# Patient Record
Sex: Male | Born: 1960
Health system: Southern US, Community
[De-identification: ages and names within clinical notes are randomized; demographics above are authoritative.]

## PROBLEM LIST (undated history)

## (undated) DIAGNOSIS — G4730 Sleep apnea, unspecified: Secondary | ICD-10-CM

## (undated) DIAGNOSIS — J189 Pneumonia, unspecified organism: Secondary | ICD-10-CM

## (undated) DIAGNOSIS — C801 Malignant (primary) neoplasm, unspecified: Secondary | ICD-10-CM

## (undated) DIAGNOSIS — G479 Sleep disorder, unspecified: Secondary | ICD-10-CM

## (undated) HISTORY — PX: OTHER SURGICAL HISTORY: SHX169

## (undated) HISTORY — DX: Malignant (primary) neoplasm, unspecified: C80.1

## (undated) HISTORY — DX: Sleep disorder, unspecified: G47.9

## (undated) HISTORY — PX: BONE MARROW ASPIRATION: SHX1252

---

## 2002-04-04 ENCOUNTER — Encounter: Payer: Self-pay | Admitting: Family Medicine

## 2002-04-04 ENCOUNTER — Ambulatory Visit (HOSPITAL_COMMUNITY): Admission: RE | Admit: 2002-04-04 | Discharge: 2002-04-04 | Payer: Self-pay | Admitting: Family Medicine

## 2003-03-17 ENCOUNTER — Emergency Department (HOSPITAL_COMMUNITY): Admission: EM | Admit: 2003-03-17 | Discharge: 2003-03-17 | Payer: Self-pay | Admitting: *Deleted

## 2005-05-29 ENCOUNTER — Ambulatory Visit (HOSPITAL_COMMUNITY): Admission: RE | Admit: 2005-05-29 | Discharge: 2005-05-29 | Payer: Self-pay | Admitting: Family Medicine

## 2011-04-18 ENCOUNTER — Ambulatory Visit (INDEPENDENT_AMBULATORY_CARE_PROVIDER_SITE_OTHER): Payer: BC Managed Care – PPO | Admitting: Physician Assistant

## 2011-04-18 VITALS — BP 133/75 | HR 70 | Temp 98.7°F | Resp 16 | Ht 71.5 in | Wt 281.8 lb

## 2011-04-18 DIAGNOSIS — L02419 Cutaneous abscess of limb, unspecified: Secondary | ICD-10-CM

## 2011-04-18 DIAGNOSIS — Z8249 Family history of ischemic heart disease and other diseases of the circulatory system: Secondary | ICD-10-CM | POA: Insufficient documentation

## 2011-04-18 DIAGNOSIS — Z8042 Family history of malignant neoplasm of prostate: Secondary | ICD-10-CM

## 2011-04-18 MED ORDER — DOXYCYCLINE HYCLATE 100 MG PO TABS: 100.0000 mg | ORAL_TABLET | Freq: Two times a day (BID) | ORAL | Status: AC

## 2011-04-18 NOTE — Progress Notes (Signed)
  Subjective:    Patient ID: Bryce Lewis, male    DOB: 1960-08-01, 51 y.o.   MRN: [id number]  HPI  51 yo male here with painful area R calf that has progressively worsened over the last 2 days.  Painful, swollen and tender.  No f/c.  No h/o MRSA.    Upon reviewing patient's chart, noticed his father had an MI at age 56.  Patient has had a cardiac work up bc of this.  His father also had prostate cancer and he has started having his PSA and and DRE done annually.  Review of Systems  All other systems reviewed and are negative.       Objective:   Physical Exam  Nursing note and vitals reviewed. Constitutional: He appears well-developed and well-nourished.       obese  Cardiovascular: Normal rate, regular rhythm, normal heart sounds and intact distal pulses.  Exam reveals no gallop and no friction rub.   No murmur heard. Pulmonary/Chest: Effort normal and breath sounds normal.  Skin: There is erythema.       R lateral lower leg ~ midway down 3cm induration and central scab present. Erythema extends out 12cm high and 5cm across.  No central fluctuance.      Assessment & Plan:  Abscess/cellulitis-not ready to open.  Salt water soaks. Aggressive rest and elevation.  No work tomorrow bc patient would have to be on his feet all day.  Start Doxy #2 from stock bottle given, take 1 tonight, 1 tomorrow.  Cleansed area.  Applied 2% mupiricin (and gave patient some in sterile container) then applied telfa, gauze and ace wraps X 2.

## 2011-04-18 NOTE — Patient Instructions (Signed)
Wound care as discussed.  Cleanse and soak bid. Recheck Thursday if not improving, sooner if worse.

## 2011-09-14 HISTORY — PX: NECK DISSECTION: SUR422

## 2011-09-14 HISTORY — PX: PAROTIDECTOMY: SUR1003

## 2013-06-20 ENCOUNTER — Encounter: Payer: Self-pay | Admitting: *Deleted

## 2013-06-23 ENCOUNTER — Encounter: Payer: Self-pay | Admitting: Neurology

## 2013-06-23 ENCOUNTER — Ambulatory Visit (INDEPENDENT_AMBULATORY_CARE_PROVIDER_SITE_OTHER): Payer: BC Managed Care – PPO | Admitting: Neurology

## 2013-06-23 ENCOUNTER — Encounter (INDEPENDENT_AMBULATORY_CARE_PROVIDER_SITE_OTHER): Payer: Self-pay

## 2013-06-23 VITALS — BP 135/80 | HR 60 | Temp 98.5°F | Ht 71.5 in | Wt 282.0 lb

## 2013-06-23 DIAGNOSIS — R51 Headache: Secondary | ICD-10-CM

## 2013-06-23 DIAGNOSIS — E669 Obesity, unspecified: Secondary | ICD-10-CM

## 2013-06-23 DIAGNOSIS — R519 Headache, unspecified: Secondary | ICD-10-CM

## 2013-06-23 DIAGNOSIS — G479 Sleep disorder, unspecified: Secondary | ICD-10-CM

## 2013-06-23 DIAGNOSIS — G4733 Obstructive sleep apnea (adult) (pediatric): Secondary | ICD-10-CM

## 2013-06-23 HISTORY — DX: Sleep disorder, unspecified: G47.9

## 2013-06-23 NOTE — Progress Notes (Signed)
Subjective:    Patient ID: Bryce Lewis is a 53 y.o. male.  HPI    Star Age, MD, PhD [facility] [address], [address] [place], [place] [address]  Dear Dr. Ethlyn Gallery,   I saw your patient, Bryce Lewis, upon your kind request in my neurologic clinic today for initial consultation of his sleep disorder, in particular, concern for underlying obstructive sleep apnea in the face of a prior abnormal overnight pulse oximetry test. The patient is unaccompanied today. As you know, Mr. Bryce Lewis is a 53 year old right-handed gentleman with an underlying medical history of osteomyelitis as a child, SCC, s/p skin grafting in 2012 and obesity, who had a overnight pulse oximetry test which I reviewed. This was ordered in the context of a cough and congestion with body aches reported at the time which was in October 2014. His pulse oximetry test from 10/25/2012 showed a mean oxygen saturation of 92.1%, a nadir of 84% and time in minutes below the saturation of 88% of 6 minutes and 52 seconds. He reports daytime sleepiness and snoring and morning HAs.   His typical bedtime is reported to be around 10 PM and usual wake time is around 5:30 AM. Sleep onset typically occurs within a few minutes. He reports feeling marginally rested upon awakening. He wakes up on an average 3 or 4 times in the middle of the night and has to go to the bathroom 0 times on a typical night. He admits to frequent morning headaches, which are dull, achy, frontal or occipital.  He reports excessive daytime somnolence (EDS) and His Epworth Sleepiness Score (ESS) is 7/24 today. He has not fallen asleep while driving. The patient has not been taking a scheduled nap.  He has been known to snore for the past many years. Snoring is reportedly moderate and associated with choking sounds. The patient admits to a sense of choking or strangling feeling. There is no report of nighttime reflux, with rare  nighttime cough experienced. The patient has not noted any RLS symptoms and is not known to kick while asleep or before falling asleep. There is no family history of RLS or OSA.  He is a fairly restless sleeper and in the morning, the bed is quite disheveled.   He denies cataplexy, sleep paralysis, hypnagogic or hypnopompic hallucinations, or sleep attacks. He does not report any vivid dreams, nightmares, dream enactments, or parasomnias, such as sleep talking or sleep walking. The patient has not had a sleep study or a home sleep test.  He consumes 6 caffeinated beverages per day, usually in the form of 2 coffee, and 3-4 sodas (diet Coke).  His bedroom is usually dark and cool. There is a TV in the bedroom and usually it is not on at night. There are 2 dogs in his bed typically. He quit smoking in 2011, he drinks alcohol occasionally.  His Past Medical History Is Significant For: Past Medical History  Diagnosis Date  . Cancer     His Past Surgical History Is Significant For: Past Surgical History  Procedure Laterality Date  . Melanoma excision      His Family History Is Significant For: Family History  Problem Relation Age of Onset  . Hypertension Mother   . Heart disease Father     His Social History Is Significant For: History   Social History  . Marital Status: Single    Spouse Name: N/A    Number of Children: N/A  . Years  of Education: N/A   Social History Main Topics  . Smoking status: Former Research scientist (life sciences)  . Smokeless tobacco: None  . Alcohol Use: No  . Drug Use: None  . Sexual Activity: None   Other Topics Concern  . None   Social History Narrative  . None    His Allergies Are:  Allergies  Allergen Reactions  . Lansoprazole   :   His Current Medications Are:  Outpatient Encounter Prescriptions as of 06/23/2013  Medication Sig  . ibuprofen (ADVIL,MOTRIN) 200 MG tablet Take 200 mg by mouth every 6 (six) hours as needed.  :  Review of Systems:  Out of a  complete 14 point review of systems, all are reviewed and negative with the exception of these symptoms as listed below: Review of Systems  Constitutional: Negative.   HENT: Negative.   Eyes: Negative.   Respiratory: Negative.   Cardiovascular: Negative.   Gastrointestinal: Negative.   Endocrine: Negative.   Genitourinary: Negative.   Musculoskeletal: Negative.   Skin: Negative.   Allergic/Immunologic: Negative.   Neurological: Negative.   Hematological: Negative.   Psychiatric/Behavioral: Positive for sleep disturbance (snoring).    Objective:  Neurologic Exam  Physical Exam Physical Examination:   Filed Vitals:   06/23/13 0818  BP: 135/80  Pulse: 60  Temp: 98.5 F (36.9 C)   General Examination: The patient is a very pleasant 53 y.o. male in no acute distress. He appears well-developed and well-nourished and well groomed. He is obese.  HEENT: Normocephalic, atraumatic, pupils are equal, round and reactive to light and accommodation. Funduscopic exam is normal with sharp disc margins noted. Extraocular tracking is good without limitation to gaze excursion or nystagmus noted. Normal smooth pursuit is noted. Hearing is grossly intact. Tympanic membranes are clear bilaterally. Face is symmetric with normal facial animation and normal facial sensation. Speech is clear with no dysarthria noted. There is no hypophonia. There is no lip, neck/head, jaw or voice tremor. Neck is supple with full range of passive and active motion. There are no carotid bruits on auscultation. Oropharynx exam reveals: mild mouth dryness, adequate dental hygiene and moderate airway crowding, due to large tongue and right tonsil being on the larger side. Mallampati is class II. Tongue protrudes centrally and palate elevates symmetrically. Tonsils are 2+ on the right and 1+ on the left. Neck size is 18.5 inches. He has a Absent overbite with a slight underbite noted . Nasal inspection reveals no significant nasal  mucosal bogginess or redness and no septal deviation. He has a R facial skin graft.   Chest: Clear to auscultation without wheezing, rhonchi or crackles noted.  Heart: S1+S2+0, regular and normal without murmurs, rubs or gallops noted.   Abdomen: Soft, non-tender and non-distended with normal bowel sounds appreciated on auscultation.  Extremities: There is no pitting edema in the distal lower extremities bilaterally. Pedal pulses are intact. He has a scar below the right knee in the medial aspect from previous surgery of his osteomyelitis as a child.  Skin: Warm and dry without trophic changes noted. There are no varicose veins.  Musculoskeletal: exam reveals no obvious joint deformities, tenderness or joint swelling or erythema.   Neurologically:  Mental status: The patient is awake, alert and oriented in all 4 spheres. His immediate and remote memory, attention, language skills and fund of knowledge are appropriate. There is no evidence of aphasia, agnosia, apraxia or anomia. Speech is clear with normal prosody and enunciation. Thought process is linear. Mood is normal and affect  is normal.  Cranial nerves II - XII are as described above under HEENT exam. In addition: shoulder shrug is normal with equal shoulder height noted. Motor exam: Normal bulk, strength and tone is noted. There is no drift, tremor or rebound. Romberg is negative. Reflexes are 2+ throughout. Babinski: Toes are flexor bilaterally. Fine motor skills and coordination: intact with normal finger taps, normal hand movements, normal rapid alternating patting, normal foot taps and normal foot agility.  Cerebellar testing: No dysmetria or intention tremor on finger to nose testing. Heel to shin is unremarkable bilaterally. There is no truncal or gait ataxia.  Sensory exam: intact to light touch, pinprick, vibration, temperature sense in the upper and lower extremities.  Gait, station and balance: He stands easily. No veering to one  side is noted. No leaning to one side is noted. Posture is age-appropriate and stance is narrow based. Gait shows normal stride length and normal pace. No problems turning are noted. He turns en bloc. Tandem walk is unremarkable. Intact toe and heel stance is noted.               Assessment and Plan:   In summary, YUMA PACELLA is a very pleasant 53 y.o.-year old male with an underlying medical history of osteomyelitis as a child, SCC, s/p skin grafting in 2012 and obesity, who had a overnight pulse oximetry test in October 2014 and reports snoring, daytime somnolence, recurrent morning headaches and waking up with a sense of gasping. His history and physical exam concerning for obstructive sleep apnea (OSA). I had a long chat with the patient about my findings and the diagnosis of OSA, its prognosis and treatment options. We talked about medical treatments, surgical interventions and non-pharmacological approaches. I explained in particular the risks and ramifications of untreated moderate to severe OSA, especially with respect to developing cardiovascular disease down the Road, including congestive heart failure, difficult to treat hypertension, cardiac arrhythmias, or stroke. Even type 2 diabetes has, in part, been linked to untreated OSA. Symptoms of untreated OSA include daytime sleepiness, memory problems, mood irritability and mood disorder such as depression and anxiety, lack of energy, as well as recurrent headaches, especially morning headaches. We talked about trying to maintain a healthy lifestyle in general, as well as the importance of weight control. I encouraged the patient to eat healthy, exercise daily and keep well hydrated, to keep a scheduled bedtime and wake time routine, to not skip any meals and eat healthy snacks in between meals. I advised the patient not to drive when feeling sleepy. I recommended the following at this time: sleep study with potential positive airway pressure  titration.  I explained the sleep test procedure to the patient and also outlined possible surgical and non-surgical treatment options of OSA, including the use of a custom-made dental device (which would require a referral to a specialist dentist or oral surgeon), upper airway surgical options, such as pillar implants, radiofrequency surgery, tongue base surgery, and UPPP (which would involve a referral to an ENT surgeon). Rarely, jaw surgery such as mandibular advancement may be considered.  I also explained the CPAP treatment option to the patient, who indicated that he would be willing to try CPAP if the need arises. I explained the importance of being compliant with PAP treatment, not only for insurance purposes but primarily to improve His symptoms, and for the patient's long term health benefit, including to reduce His cardiovascular risks. I answered all his questions today and the patient was  in agreement. I would like to see him back after the sleep study is completed and encouraged him to call with any interim questions, concerns, problems or updates.   Thank you very much for allowing me to participate in the care of this nice patient. If I can be of any further assistance to you please do not hesitate to call me at [phone].  Sincerely,   Star Age, MD, PhD

## 2013-06-23 NOTE — Patient Instructions (Addendum)
Based on your symptoms and your exam I believe you are at risk for obstructive sleep apnea or OSA, and I think we should proceed with a sleep study to determine whether you do or do not have OSA and how severe it is. If you have more than mild OSA, I want you to consider treatment with CPAP. Please remember, the risks and ramifications of moderate to severe obstructive sleep apnea or OSA are: Cardiovascular disease, including congestive heart failure, stroke, difficult to control hypertension, arrhythmias, and even type 2 diabetes has been linked to untreated OSA. Sleep apnea causes disruption of sleep and sleep deprivation in most cases, which, in turn, can cause recurrent headaches, problems with memory, mood, concentration, focus, and vigilance. Most people with untreated sleep apnea report excessive daytime sleepiness, which can affect their ability to drive. Please do not drive if you feel sleepy.  I will see you back after your sleep study to go over the test results and where to go from there. We will call you after your sleep study and to set up an appointment at the time.   Please remember to try to maintain good sleep hygiene, which means: Keep a regular sleep and wake schedule, try not to exercise or have a meal within 2 hours of your bedtime, try to keep your bedroom conducive for sleep, that is, cool and dark, without light distractors such as an illuminated alarm clock, and refrain from watching TV right before sleep or in the middle of the night and do not keep the TV or radio on during the night. Also, try not to use or play on electronic devices at bedtime, such as your cell phone, tablet PC or laptop. If you like to read at bedtime on an electronic device, try to dim the background light as much as possible. Do not eat in the middle of the night.   Please reduce your caffeine intake and move up the time of your last caffeine drink.

## 2013-08-01 ENCOUNTER — Encounter: Payer: Self-pay | Admitting: Neurology

## 2013-08-01 ENCOUNTER — Ambulatory Visit (INDEPENDENT_AMBULATORY_CARE_PROVIDER_SITE_OTHER): Payer: BC Managed Care – PPO | Admitting: Neurology

## 2013-08-01 DIAGNOSIS — G4733 Obstructive sleep apnea (adult) (pediatric): Secondary | ICD-10-CM

## 2013-08-01 DIAGNOSIS — G479 Sleep disorder, unspecified: Secondary | ICD-10-CM

## 2013-08-01 DIAGNOSIS — E669 Obesity, unspecified: Secondary | ICD-10-CM

## 2013-08-15 ENCOUNTER — Telehealth: Payer: Self-pay | Admitting: Neurology

## 2013-08-15 NOTE — Telephone Encounter (Signed)
This patient has overall mild sleep disordered breathing, more pronounced in supine sleep and moderate in degree when in REM sleep. Due to the overall mild nature of the patient's sleep apnea, there are several therapeutic avenues available. I would like to discuss options during FU appt. pls call patient to notify and arrange appt. thx

## 2013-08-16 NOTE — Telephone Encounter (Signed)
Called the patient and discussed sleep study results.  He is aware of finding of mild osa based on sleep study and Dr. Guadelupe Sabin recommendation that he schedule a follow up appointment with her to discuss results in detail and treatment options.  Pt has scheduled follow up.  A copy of this report will be sent to Dr. Micheline Rough.  The patient will receive the sleep study report via mail.

## 2013-09-03 ENCOUNTER — Encounter: Payer: Self-pay | Admitting: Neurology

## 2013-09-03 ENCOUNTER — Ambulatory Visit (INDEPENDENT_AMBULATORY_CARE_PROVIDER_SITE_OTHER): Payer: BC Managed Care – PPO | Admitting: Neurology

## 2013-09-03 VITALS — BP 118/79 | HR 61 | Temp 97.9°F | Ht 72.0 in | Wt 288.0 lb

## 2013-09-03 DIAGNOSIS — G479 Sleep disorder, unspecified: Secondary | ICD-10-CM

## 2013-09-03 DIAGNOSIS — R51 Headache: Secondary | ICD-10-CM

## 2013-09-03 DIAGNOSIS — G4733 Obstructive sleep apnea (adult) (pediatric): Secondary | ICD-10-CM

## 2013-09-03 DIAGNOSIS — R519 Headache, unspecified: Secondary | ICD-10-CM

## 2013-09-03 DIAGNOSIS — E669 Obesity, unspecified: Secondary | ICD-10-CM

## 2013-09-03 NOTE — Patient Instructions (Signed)
We will let you try autoPAP at home for treatment of your OSA. I will see you back in about 8 weeks.

## 2013-09-03 NOTE — Progress Notes (Signed)
Subjective:    Patient ID: Bryce Lewis is a 53 y.o. male.  HPI    Interim history:   Bryce Lewis is a 53 year old right-handed gentleman with an underlying medical history of osteomyelitis as a child, SCC, s/p skin grafting in 2012 and obesity, who presents for followup consultation after his recent baseline sleep study. He is unaccompanied today. I first met him on 06/23/2013 at the request of his primary care physician, at which time he reported an abnormal overnight pulse oximetry test which I reviewed. This was done at a time when he suffered from a cough and congestion with body aches reported at the time. His pulse oximetry test from 10/25/2012 showed a mean oxygen saturation of 92.1%, a nadir of 84% and time in minutes below the saturation of 88% of 6 minutes and 52 seconds. He reported daytime sleepiness, snoring and morning HAs. I suggested he return for sleep study. He had a baseline sleep study on 08/01/2013 and went over his test results with him in detail today. His sleep efficiency was normal at 90.3% with a latency to sleep normal at 14 minutes. Wake after sleep onset was 27.5 minutes with moderate sleep fragmentation noted. He had a normal arousal index. He had a normal percentage of light stage sleep, and normal percentage of deep sleep, and a normal percentage of REM sleep, with a normal REM latency. He had no significant PLMS. He had no significant EKG changes. Mild to moderate snoring was noted. He slept mostly on his back. His total AHI was 9.5 per hour rising to 29.4 per hour in REM sleep, and 13 per hour in the supine position. Baseline oxygen saturation was 91% with a nadir of 80% during REM sleep period time below 88% saturation was 12 minutes and 40 seconds. Today, he reports no change in his medical hx or Sx. He has AH HAs about 3/7 days.   His Past Medical History Is Significant For: Past Medical History  Diagnosis Date  . Cancer   . Sleep disturbance 06/23/2013     His Past Surgical History Is Significant For: Past Surgical History  Procedure Laterality Date  . Melanoma excision      Her Family History Is Significant For: Family History  Problem Relation Age of Onset  . Hypertension Mother   . Parkinson's disease Mother   . Heart disease Father     His Social History Is Significant For: History   Social History  . Marital Status: Single    Spouse Name: N/A    Number of Children: N/A  . Years of Education: N/A   Social History Main Topics  . Smoking status: Former Research scientist (life sciences)  . Smokeless tobacco: Never Used  . Alcohol Use: No  . Drug Use: None  . Sexual Activity: None   Other Topics Concern  . None   Social History Narrative  . None    His Allergies Are:  Allergies  Allergen Reactions  . Lansoprazole   :   His Current Medications Are:  Outpatient Encounter Prescriptions as of 09/03/2013  Medication Sig  . ibuprofen (ADVIL,MOTRIN) 200 MG tablet Take 200 mg by mouth every 6 (six) hours as needed.  :  Review of Systems:  Out of a complete 14 point review of systems, all are reviewed and negative with the exception of these symptoms as listed below:   Review of Systems  All other systems reviewed and are negative.  Objective:  Neurologic Exam  Physical Exam Physical Examination:  Filed Vitals:   09/03/13 0828  BP: 118/79  Pulse: 61  Temp: 97.9 F (36.6 C)   General Examination: The patient is a very pleasant 53 y.o. male in no acute distress. He appears well-developed and well-nourished and well groomed. He is obese. He has gained a few pounds.  HEENT: Normocephalic, atraumatic, pupils are equal, round and reactive to light and accommodation. Funduscopic exam is normal with sharp disc margins noted. Extraocular tracking is good without limitation to gaze excursion or nystagmus noted. Normal smooth pursuit is noted. Hearing is grossly intact. Face is symmetric with normal facial animation and normal facial  sensation. Speech is clear with no dysarthria noted. There is no hypophonia. There is no lip, neck/head, jaw or voice tremor. Neck is supple with full range of passive and active motion. There are no carotid bruits on auscultation. Oropharynx exam reveals: mild mouth dryness, adequate dental hygiene and moderate airway crowding, due to large tongue and right tonsil being on the larger side. Mallampati is class II. Tongue protrudes centrally and palate elevates symmetrically. Tonsils are 2+ on the right and 1+ on the left. Neck size is 18.5 inches. He has a Absent overbite with a slight underbite noted . Nasal inspection reveals no significant nasal mucosal bogginess or redness and no septal deviation. He has a R facial skin graft, unchanged and unremarkable.   Chest: Clear to auscultation without wheezing, rhonchi or crackles noted.  Heart: S1+S2+0, regular and normal without murmurs, rubs or gallops noted.   Abdomen: Soft, non-tender and non-distended with normal bowel sounds appreciated on auscultation.  Extremities: There is no pitting edema in the distal lower extremities bilaterally. Pedal pulses are intact. He has a scar below the right knee in the medial aspect from previous surgery of his osteomyelitis as a child.  Skin: Warm and dry without trophic changes noted. There are a mild degree of varicose veins in the left calf, which are unchanged from before, which were not documented last time.  Musculoskeletal: exam reveals no obvious joint deformities, tenderness or joint swelling or erythema.   Neurologically:  Mental status: The patient is awake, alert and oriented in all 4 spheres. His immediate and remote memory, attention, language skills and fund of knowledge are appropriate. There is no evidence of aphasia, agnosia, apraxia or anomia. Speech is clear with normal prosody and enunciation. Thought process is linear. Mood is normal and affect is normal.  Cranial nerves II - XII are as  described above under HEENT exam. In addition: shoulder shrug is normal with equal shoulder height noted. Motor exam: Normal bulk, strength and tone is noted. There is no drift, tremor or rebound. Romberg is negative. Reflexes are 2+ throughout. Fine motor skills and coordination: intact with normal finger taps, normal hand movements, normal rapid alternating patting, normal foot taps and normal foot agility.  Cerebellar testing: No dysmetria or intention tremor on finger to nose testing. Heel to shin is unremarkable bilaterally. There is no truncal or gait ataxia.  Sensory exam: intact to light touch.  Gait, station and balance: He stands easily. No veering to one side is noted. No leaning to one side is noted. Posture is age-appropriate and stance is narrow based. Gait shows normal stride length and normal pace. No problems turning are noted. He turns en bloc. Tandem walk is unremarkable. Intact toe and heel stance is noted.               Assessment and Plan:   In summary, Leonardo  D Ahlquist is a very pleasant 53 year old male with an underlying medical history of osteomyelitis as a child, SCC, s/p skin grafting in 2012 and obesity, who had an abnormal overnight pulse oximetry test in October 2014 and reported snoring, daytime somnolence, recurrent morning headaches and waking up with a sense of gasping. His recent baseline sleep study demonstrated overall mild sleep disordered breathing but moderate REM-related obstructive sleep apnea with an oxyhemoglobin desaturation nadir of 80% and a significant time below 88% saturation for a total of almost 13 minutes . Today I explained the sleep test results in detail to the patient and we talked about the diagnosis of OSA, its prognosis and treatment options. We talked about medical treatments, surgical interventions and non-pharmacological approaches. I explained in particular the risks and ramifications of untreated  OSA, especially moderate to severe OSA,   particularly with respect to developing cardiovascular disease down the Road, including congestive heart failure, difficult to treat hypertension, cardiac arrhythmias, or stroke. Even type 2 diabetes has, in part, been linked to untreated OSA. Symptoms of untreated OSA include daytime sleepiness, memory problems, mood irritability and mood disorder such as depression and anxiety, lack of energy, as well as recurrent headaches, especially morning headaches. We talked about trying to maintain a healthy lifestyle in general, as well as the importance of weight control. I encouraged the patient to eat healthy, exercise daily and keep well hydrated, to keep a scheduled bedtime and wake time routine, to not skip any meals and eat healthy snacks in between meals. I advised the patient not to drive when feeling sleepy. I recommended the following at this time: since he does not have an overbite and rather has a slight underbite, I think he is not going to be an appropriate candidate for an oral appliance. Weight loss is certainly a treatment option but does take time and is going to take a significant patient effort. Patient's work higher than have lack of energy do find it much harder to participate in physical activities. Surgical options he would like to utilize only as a last resort. At this juncture, I would like to suggest he try AutoPap at home. We could set him up with a positive airway pressure ranged at home and give him an opportunity to try out AutoPap for about 6 weeks or so and I will see him back afterwards. He was agreeable to pursuing this route of treatment and I placed an order for AutoPap set up at home. I will see him back in about 6-8 weeks with a download. He is advised to be compliant with treatment. He is advised to call with any interim questions.

## 2013-10-17 ENCOUNTER — Encounter: Payer: Self-pay | Admitting: Neurology

## 2013-10-27 ENCOUNTER — Encounter: Payer: Self-pay | Admitting: Neurology

## 2013-10-28 ENCOUNTER — Encounter: Payer: Self-pay | Admitting: Neurology

## 2013-10-28 ENCOUNTER — Ambulatory Visit (INDEPENDENT_AMBULATORY_CARE_PROVIDER_SITE_OTHER): Payer: BC Managed Care – PPO | Admitting: Neurology

## 2013-10-28 ENCOUNTER — Encounter (INDEPENDENT_AMBULATORY_CARE_PROVIDER_SITE_OTHER): Payer: Self-pay

## 2013-10-28 VITALS — BP 129/78 | HR 66 | Temp 98.6°F | Ht 72.0 in | Wt 288.0 lb

## 2013-10-28 DIAGNOSIS — Z9989 Dependence on other enabling machines and devices: Principal | ICD-10-CM

## 2013-10-28 DIAGNOSIS — E669 Obesity, unspecified: Secondary | ICD-10-CM

## 2013-10-28 DIAGNOSIS — G4733 Obstructive sleep apnea (adult) (pediatric): Secondary | ICD-10-CM

## 2013-10-28 NOTE — Progress Notes (Signed)
Subjective:    Patient ID: Bryce Lewis is a 53 y.o. male.  HPI    Interim history:  Mr. Hillery is a 53 year old right-handed gentleman with an underlying medical history of osteomyelitis as a child, SCC, s/p skin grafting in 2012 and obesity, who presents for followup consultation of his obstructive sleep apnea treated with AutoPap at home. He is unaccompanied today. I last saw him on 09/03/2013, at which time we talked about his sleep study results and I suggested a trial of AutoPap and a closer followup with compliance data.  Today, I reviewed his compliance data from 09/27/2013 to 10/26/2013 which is a total of 30 days during which time he used his machine 26 days. Percent most days greater than 4 hours was 23 days, which is 77%, adequate compliance. Pressure ranges from 6-13 cm with 95th percentile at 7.7 cm EPR of 3. Leak was low with 95th percentile at 3.3 L per minute, residual AHI level at 1.1 per hour, average usage only 4 hours and 49 minutes for all days and 5 hours and 33 minutes for days used.  Today, he reports doing better with positive airway pressure treatment. He sleeps. He notices the biggest difference and has morning headaches which have essentially resolved since he started treatment. He's very pleased with that I would like to continue using CPAP. He skipped a few days because of the weekend. He says that his grandchildren usually come in and stay over and hemet him on had skipped a few days because of that. In the beginning he also had a few days where he was not able to use it over 4 hours because he hadn't quite adjusted yet. Since then, he has adjusted well to treatment. He does not have much in the way of mouth dryness or difficulty using the mask or machine. He has had no other changes to his medical history or medications. Weight is stable.  I first saw him on 06/23/2013 at the request of his primary care physician, at which time he reported an abnormal overnight pulse  oximetry test which I reviewed. This was done at a time when he suffered from a cough and congestion with body aches reported at the time. His pulse oximetry test from 10/25/2012 showed a mean oxygen saturation of 92.1%, a nadir of 84% and time in minutes below the saturation of 88% of 6 minutes and 52 seconds. He reported daytime sleepiness, snoring and morning HAs. I suggested he return for sleep study. He had a baseline sleep study on 08/01/2013. His sleep efficiency was normal at 90.3% with a latency to sleep normal at 14 minutes. Wake after sleep onset was 27.5 minutes with moderate sleep fragmentation noted. He had a normal arousal index. He had a normal percentage of light stage sleep, and normal percentage of deep sleep, and a normal percentage of REM sleep, with a normal REM latency. He had no significant PLMs. He had no significant EKG changes. Mild to moderate snoring was noted. He slept mostly on his back. His total AHI was 9.5 per hour rising to 29.4 per hour in REM sleep, and 13 per hour in the supine position. Baseline oxygen saturation was 91% with a nadir of 80% during REM sleep period time below 88% saturation was 12 minutes and 40 seconds.   His Past Medical History Is Significant For: Past Medical History  Diagnosis Date  . Cancer   . Sleep disturbance 06/23/2013    His Past Surgical History Is  Significant For: Past Surgical History  Procedure Laterality Date  . Melanoma excision      His Family History Is Significant For: Family History  Problem Relation Age of Onset  . Hypertension Mother   . Parkinson's disease Mother   . Heart disease Father     His Social History Is Significant For: History   Social History  . Marital Status: Married    Spouse Name: Tabitha    Number of Children: 2  . Years of Education: 12   Occupational History  .      [employer]   Social History Main Topics  . Smoking status: Former Research scientist (life sciences)  . Smokeless tobacco: Never Used  .  Alcohol Use: Yes  . Drug Use: None  . Sexual Activity: None   Other Topics Concern  . None   Social History Narrative   Consumes a couple cups of caffeine daily, right handed    His Allergies Are:  Allergies  Allergen Reactions  . Lansoprazole   :   His Current Medications Are:  Outpatient Encounter Prescriptions as of 10/28/2013  Medication Sig  . ibuprofen (ADVIL,MOTRIN) 200 MG tablet Take 200 mg by mouth every 6 (six) hours as needed.  :  Review of Systems:  Out of a complete 14 point review of systems, all are reviewed and negative with the exception of these symptoms as listed below:  Review of Systems  Neurological:       Apnea,snoring    Objective:  Neurologic Exam  Physical Exam Physical Examination:   Filed Vitals:   10/28/13 0836  BP: 129/78  Pulse: 66  Temp: 98.6 F (37 C)   General Examination: The patient is a very pleasant 53 y.o. male in no acute distress. He appears well-developed and well-nourished and well groomed. He is obese. His weight is stable from last time.  HEENT: Normocephalic, atraumatic, pupils are equal, round and reactive to light and accommodation. Funduscopic exam is normal with sharp disc margins noted. Extraocular tracking is good without limitation to gaze excursion or nystagmus noted. Normal smooth pursuit is noted. Hearing is grossly intact. Face is symmetric with normal facial animation and normal facial sensation. Speech is clear with no dysarthria noted. There is no hypophonia. There is no lip, neck/head, jaw or voice tremor. Neck is supple with full range of passive and active motion. There are no carotid bruits on auscultation. Oropharynx exam reveals: mild mouth dryness, adequate dental hygiene and moderate airway crowding, due to large tongue and right tonsil being on the larger side. Mallampati is class II. Tongue protrudes centrally and palate elevates symmetrically. Tonsils are 2+ on the right and 1+ on the left. Nasal  inspection reveals no significant nasal mucosal bogginess or redness and no septal deviation and no soreness from the nasal pillows is noted. He has a R facial skin graft, unchanged and unremarkable.   Chest: Clear to auscultation without wheezing, rhonchi or crackles noted.  Heart: S1+S2+0, regular and normal without murmurs, rubs or gallops noted.   Abdomen: Soft, non-tender and non-distended with normal bowel sounds appreciated on auscultation.  Extremities: There is no pitting edema in the distal lower extremities bilaterally. Pedal pulses are intact. He has a scar below the right knee in the medial aspect from previous surgery of his osteomyelitis as a child.  Skin: Warm and dry without trophic changes noted. There are a mild degree of varicose veins in the left calf, which are unchanged from before, which were not documented  last time.  Musculoskeletal: exam reveals no obvious joint deformities, tenderness or joint swelling or erythema.   Neurologically:  Mental status: The patient is awake, alert and oriented in all 4 spheres. His immediate and remote memory, attention, language skills and fund of knowledge are appropriate. There is no evidence of aphasia, agnosia, apraxia or anomia. Speech is clear with normal prosody and enunciation. Thought process is linear. Mood is normal and affect is normal.  Cranial nerves II - XII are as described above under HEENT exam. In addition: shoulder shrug is normal with equal shoulder height noted. Motor exam: Normal bulk, strength and tone is noted. There is no drift, tremor or rebound. Romberg is negative. Reflexes are 2+ throughout. Fine motor skills and coordination: intact with normal finger taps, normal hand movements, normal rapid alternating patting, normal foot taps and normal foot agility.  Cerebellar testing: No dysmetria or intention tremor on finger to nose testing. Heel to shin is unremarkable bilaterally. There is no truncal or gait ataxia.   Sensory exam: intact to light touch, pinprick, temperature, vibration in the upper and lower extremities.  Gait, station and balance: He stands easily. No veering to one side is noted. No leaning to one side is noted. Posture is age-appropriate and stance is narrow based. Gait shows normal stride length and normal pace. No problems turning are noted. He turns en bloc. Tandem walk is slightly difficult in the beginning, then unremarkable.            Assessment and Plan:   In summary, ARLOW SPIERS is a very pleasant 53 year old male with an underlying medical history of osteomyelitis as a child, SCC, s/p skin grafting in 2012, and obesity, with an abnormal overnight pulse oximetry test in October 2014, who presents for followup consultation of his overall mild obstructive sleep apnea, more pronounced during REM sleep. He had reported snoring, morning headaches and waking up with a sense of gasping.  last time he was here in August I suggested a trial of Ovral patch. We talked about his sleep test results again and his compliance data. I congratulated him on his great compliance. I did advise him to try not to skip any days. He indicates improvement in his sleep but primarily his morning headaches which is a great benefit for him. He is pleased with how he's doing. I would like to change him from AutoPap to a set pressure of 8 cm which seems to be the pressure most of the time. His  leak is low which means that good fit of his nasal pillows which have not caused him any trouble such as nasal sores were significant nasal dryness. At this juncture, since he is doing better, would like to see him back in 6 months, sooner if the need arises and I encouraged him to continue using CPAP regularly. I did again advise him of the importance of weight control.  I have also asked him to sign up for her electronic chart access so he can e-mail me with questions if need be. He was in agreement.

## 2013-10-28 NOTE — Patient Instructions (Signed)

## 2014-04-02 ENCOUNTER — Telehealth: Payer: Self-pay | Admitting: Neurology

## 2014-04-02 ENCOUNTER — Encounter: Payer: Self-pay | Admitting: *Deleted

## 2014-04-02 NOTE — Telephone Encounter (Signed)
Left VM message to call back to reschedule appointment on 04/30/2014 due to template change.

## 2014-04-21 ENCOUNTER — Encounter: Payer: Self-pay | Admitting: Neurology

## 2014-04-21 ENCOUNTER — Ambulatory Visit (INDEPENDENT_AMBULATORY_CARE_PROVIDER_SITE_OTHER): Payer: BLUE CROSS/BLUE SHIELD | Admitting: Neurology

## 2014-04-21 VITALS — BP 136/86 | HR 78 | Resp 16 | Ht 72.0 in | Wt 299.0 lb

## 2014-04-21 DIAGNOSIS — R0981 Nasal congestion: Secondary | ICD-10-CM | POA: Diagnosis not present

## 2014-04-21 DIAGNOSIS — Z9989 Dependence on other enabling machines and devices: Principal | ICD-10-CM

## 2014-04-21 DIAGNOSIS — J349 Unspecified disorder of nose and nasal sinuses: Secondary | ICD-10-CM | POA: Diagnosis not present

## 2014-04-21 DIAGNOSIS — E669 Obesity, unspecified: Secondary | ICD-10-CM | POA: Diagnosis not present

## 2014-04-21 DIAGNOSIS — G4733 Obstructive sleep apnea (adult) (pediatric): Secondary | ICD-10-CM | POA: Diagnosis not present

## 2014-04-21 NOTE — Patient Instructions (Signed)
Please continue using your CPAP regularly. While your insurance requires that you use CPAP at least 4 hours each night on 70% of the nights, I recommend, that you not skip any nights and use it throughout the night if you can. Getting used to CPAP and staying with the treatment long term does take time and patience and discipline. Untreated obstructive sleep apnea when it is moderate to severe can have an adverse impact on cardiovascular health and raise her risk for heart disease, arrhythmias, hypertension, congestive heart failure, stroke and diabetes. Untreated obstructive sleep apnea causes sleep disruption, nonrestorative sleep, and sleep deprivation. This can have an impact on your day to day functioning and cause daytime sleepiness and impairment of cognitive function, memory loss, mood disturbance, and problems focussing. Using CPAP regularly can improve these symptoms.  Please look into using a salt water nasal rinse system, such as the [employer] and follow the directions and my instructions. It may help your nasal congestion and help you tolerate your CPAP.

## 2014-04-21 NOTE — Progress Notes (Signed)
Subjective:    Patient ID: Bryce Lewis is a 54 y.o. male.  HPI     Interim history:   Bryce Lewis is a 54 year old right-handed gentleman with an underlying medical history of osteomyelitis as a child, SCC, s/p skin grafting in 2012 and obesity, who presents for followup consultation of his obstructive sleep apnea treated with AutoPap at home. He is unaccompanied today. I last saw him on 10/28/2013, at which time he reported doing better with positive airway pressure treatment. He felt that his biggest improvement was the absence of morning headaches. He was very pleased with that. He had some difficulty adjusting in the very beginning but since then had been doing fine. He was on AutoPap at the time and change that to a set pressure of 8 cm at his last visit.   Today, 04/21/2014: I reviewed his CPAP compliance data from 03/21/2014 through 04/19/2014 which is a total of 30 days during which time he used his machine only 18 days with percent used days greater than 4 hours of only 47%, indicating suboptimal compliance with an average usage for days on machine at 4 hours and 53 minutes and for all days average usage was 2 hours and 56 minutes only. Residual AHI good at 1.1 per hour and leak acceptable with the 95th percentile at 12.9 L/m on a set pressure of 8 cm with EPR of 3.  Today, 04/21/2014: He reports that he had issues with nasal congestion and was not able to use his machine as well. As far as the pressure change, he did not notice any significant changes as far as the tolerance or mask fitting goes. He has tried over-the-counter Zyrtec. He is now on over-the-counter Flonase. He took some amoxicillin, which was actually prescribed for his wife. He did not see his primary care physician or went to urgent care. In fact, he needs to find a new primary care physician as his PCP has retired. He would like to establish care with Dr. Nevada Crane in [place], [place] and is on his wait list. He has had  some recent sinus type headaches. He has gained about 11 pounds since his last visit. He is trying to lose weight and is cutting out sodas from his diet.  Previously:  I saw him on 09/03/2013, at which time we talked about his sleep study results and I suggested a trial of AutoPap and a closer followup with compliance data.    I reviewed his compliance data from 09/27/2013 to 10/26/2013 which is a total of 30 days during which time he used his machine 26 days. Percent most days greater than 4 hours was 23 days, which is 77%, adequate compliance. Pressure ranges from 6-13 cm with 95th percentile at 7.7 cm EPR of 3. Leak was low with 95th percentile at 3.3 L per minute, residual AHI level at 1.1 per hour, average usage only 4 hours and 49 minutes for all days and 5 hours and 33 minutes for days used.  I first saw him on 06/23/2013 at the request of his primary care physician, at which time he reported an abnormal overnight pulse oximetry test which I reviewed. This was done at a time when he suffered from a cough and congestion with body aches reported at the time. His pulse oximetry test from 10/25/2012 showed a mean oxygen saturation of 92.1%, a nadir of 84% and time in minutes below the saturation of 88% of 6 minutes and 52 seconds. He reported daytime  sleepiness, snoring and morning HAs. I suggested he return for sleep study. He had a baseline sleep study on 08/01/2013. His sleep efficiency was normal at 90.3% with a latency to sleep normal at 14 minutes. Wake after sleep onset was 27.5 minutes with moderate sleep fragmentation noted. He had a normal arousal index. He had a normal percentage of light stage sleep, and normal percentage of deep sleep, and a normal percentage of REM sleep, with a normal REM latency. He had no significant PLMs. He had no significant EKG changes. Mild to moderate snoring was noted. He slept mostly on his back. His total AHI was 9.5 per hour rising to 29.4 per hour in REM sleep,  and 13 per hour in the supine position. Baseline oxygen saturation was 91% with a nadir of 80% during REM sleep period time below 88% saturation was 12 minutes and 40 seconds.    His Past Medical History Is Significant For: Past Medical History  Diagnosis Date  . Cancer   . Sleep disturbance 06/23/2013    His Past Surgical History Is Significant For: Past Surgical History  Procedure Laterality Date  . Melanoma excision      His Family History Is Significant For: Family History  Problem Relation Age of Onset  . Hypertension Mother   . Parkinson's disease Mother   . Heart disease Father     His Social History Is Significant For: History   Social History  . Marital Status: Married    Spouse Name: Bryce Lewis  . Number of Children: 2  . Years of Education: 12   Occupational History  .      [employer]   Social History Main Topics  . Smoking status: Former Research scientist (life sciences)  . Smokeless tobacco: Never Used  . Alcohol Use: Yes  . Drug Use: Not on file  . Sexual Activity: Not on file   Other Topics Concern  . None   Social History Narrative   Consumes a couple cups of caffeine daily, right handed    His Allergies Are:  Allergies  Allergen Reactions  . Lansoprazole   :   His Current Medications Are:  Outpatient Encounter Prescriptions as of 04/21/2014  Medication Sig  . ibuprofen (ADVIL,MOTRIN) 200 MG tablet Take 200 mg by mouth every 6 (six) hours as needed.  :  Review of Systems:  Out of a complete 14 point review of systems, all are reviewed and negative with the exception of these symptoms as listed below:   Review of Systems  All other systems reviewed and are negative.  Recent nasal congestion and sinus issues. Some weight gain.  Objective:  Neurologic Exam  Physical Exam Physical Examination:   Filed Vitals:   04/21/14 1545  BP: 136/86  Pulse: 78  Resp: 16   General Examination: The patient is a very pleasant 54 y.o. male in no acute distress.  He appears well-developed and well-nourished and well groomed. He is obese. His weight is  increased by about 11 pounds from last time.  HEENT: Normocephalic, atraumatic, pupils are equal, round and reactive to light and accommodation. Funduscopic exam is normal with sharp disc margins noted. Extraocular tracking is good without limitation to gaze excursion or nystagmus noted. Normal smooth pursuit is noted. Hearing is grossly intact. Face is symmetric with normal facial animation and normal facial sensation. Speech is clear with no dysarthria noted. There is no hypophonia. There is no lip, neck/head, jaw or voice tremor. Neck is supple with full range of  passive and active motion. There are no carotid bruits on auscultation. Oropharynx exam reveals: mild mouth dryness, adequate dental hygiene and moderate airway crowding, due to large tongue and right tonsil being on the larger side. Mallampati is class II. Tongue protrudes centrally and palate elevates symmetrically. Tonsils are 2+ on the right and 1+ on the left. Nasal inspection reveals no significant nasal mucosal bogginess, but mild redness R>L, and no septal deviation and no soreness from the nasal pillows is noted. He has a R facial skin graft, unchanged and unremarkable, with decreased sensation on his skin graft.   Chest: Clear to auscultation without wheezing, rhonchi or crackles noted.  Heart: S1+S2+0, regular and normal without murmurs, rubs or gallops noted.   Abdomen: Soft, non-tender and non-distended with normal bowel sounds appreciated on auscultation.  Extremities: There is no pitting edema in the distal lower extremities bilaterally. Pedal pulses are intact. He has a scar below the right knee in the medial aspect from previous surgery of his osteomyelitis as a child.  Skin: Warm and dry without trophic changes noted. There are a mild degree of varicose veins in the left calf, which are unchanged from before, which were not documented  last time.  Musculoskeletal: exam reveals no obvious joint deformities, tenderness or joint swelling or erythema.   Neurologically:  Mental status: The patient is awake, alert and oriented in all 4 spheres. His immediate and remote memory, attention, language skills and fund of knowledge are appropriate. There is no evidence of aphasia, agnosia, apraxia or anomia. Speech is clear with normal prosody and enunciation. Thought process is linear. Mood is normal and affect is normal.  Cranial nerves II - XII are as described above under HEENT exam. In addition: shoulder shrug is normal with equal shoulder height noted. Motor exam: Normal bulk, strength and tone is noted. There is no drift, tremor or rebound. Romberg is negative. Reflexes are 2+ throughout. Fine motor skills and coordination: intact with normal finger taps, normal hand movements, normal rapid alternating patting, normal foot taps and normal foot agility.  Cerebellar testing: No dysmetria or intention tremor on finger to nose testing. Heel to shin is unremarkable bilaterally. There is no truncal or gait ataxia.  Sensory exam: intact to light touch, pinprick, temperature, vibration in the upper and lower extremities.  Gait, station and balance: He stands easily. No veering to one side is noted. No leaning to one side is noted. Posture is age-appropriate and stance is narrow based. Gait shows normal stride length and normal pace. No problems turning are noted. He turns en bloc. Tandem walk is unremarkable.            Assessment and Plan:   In summary, Bryce Lewis is a very pleasant 54 year old male with an underlying medical history of osteomyelitis as a child, SCC, s/p surgery and skin grafting in 2012, and obesity, with an abnormal overnight pulse oximetry test in October 2014, who presents for followup consultation of his obstructive sleep apnea, more pronounced during REM sleep, established on CPAP treatment after an AutoPap trial. He  had reported snoring, morning headaches and waking up with a sense of gasping. We talked about his sleep test results again and his compliance data. Previously he was better with his compliance. He has had some lapses in treatment secondary to nasal congestion and sinus issues. Overall, he feels that since starting treatment his sleep had improved in particular his morning headaches. The current settings suggest a good treatment pressure  and acceptable leak. At this juncture,  I suggested a recheck in a few months. I also suggested he continue using Flonase for nasal decongestion and also look into nasal saline rinses which can help reduce nasal allergy symptoms and congestion. I encouraged him to continue using CPAP regularly. I did again advise him of the importance of weight control.  I have also asked him to sign up for My Chart so he can e-mail me with questions if need be. He was in agreement. I spent 15 minutes in total face-to-face time with the patient, more than 50% of which was spent in counseling and coordination of care, reviewing test results, reviewing medication and discussing or reviewing the diagnosis of OSA, its prognosis and treatment options.

## 2014-04-30 ENCOUNTER — Ambulatory Visit: Payer: BC Managed Care – PPO | Admitting: Neurology

## 2014-07-01 ENCOUNTER — Encounter (INDEPENDENT_AMBULATORY_CARE_PROVIDER_SITE_OTHER): Payer: Self-pay | Admitting: *Deleted

## 2014-07-23 ENCOUNTER — Encounter (INDEPENDENT_AMBULATORY_CARE_PROVIDER_SITE_OTHER): Payer: Self-pay | Admitting: Internal Medicine

## 2014-07-23 ENCOUNTER — Other Ambulatory Visit (INDEPENDENT_AMBULATORY_CARE_PROVIDER_SITE_OTHER): Payer: Self-pay | Admitting: *Deleted

## 2014-07-23 ENCOUNTER — Telehealth (INDEPENDENT_AMBULATORY_CARE_PROVIDER_SITE_OTHER): Payer: Self-pay | Admitting: *Deleted

## 2014-07-23 ENCOUNTER — Ambulatory Visit (INDEPENDENT_AMBULATORY_CARE_PROVIDER_SITE_OTHER): Payer: BLUE CROSS/BLUE SHIELD | Admitting: Internal Medicine

## 2014-07-23 VITALS — BP 116/70 | HR 72 | Temp 99.0°F | Ht 73.0 in | Wt 290.4 lb

## 2014-07-23 DIAGNOSIS — K625 Hemorrhage of anus and rectum: Secondary | ICD-10-CM

## 2014-07-23 NOTE — Progress Notes (Signed)
   Subjective:    Patient ID: Bryce Lewis, male    DOB: [birth year], 54 y.o.   MRN: [id number]  HPI Referred to our office by Dr. Wende Neighbors for rectal bleeding/diagnostic colonoscopy. He tells me he has had some rectal bleeding x 3. He noticed the bleeding when he wiped. Symptom x 1 month. No blood x 2 weeks. Stools were not hard nor did he strain. No change in his stools. He usually has a BM x 1 a day.  No melena. Appetite good. No unintentional weight loss. No abdominal pain.  No family hx of colon cancer. Takes Motrin/Aleve as needed but no very often. He has never had a colonoscopy in the past.  06/06/2014 H and H 16.3 and 46.9, MCV 89.7, Platelet ct 230.   Review of Systems Past Medical History  Diagnosis Date  . Cancer     Bone cancer as a child. Squamous to face  . Sleep disturbance 06/23/2013    Past Surgical History  Procedure Laterality Date  . Squamous cell carcinoma to rt side of face removed at [facility]      Allergies  Allergen Reactions  . Lansoprazole     Current Outpatient Prescriptions on File Prior to Visit  Medication Sig Dispense Refill  . ibuprofen (ADVIL,MOTRIN) 200 MG tablet Take 200 mg by mouth every 6 (six) hours as needed.     No current facility-administered medications on file prior to visit.        Objective:   Physical Exam Blood pressure 116/70, pulse 72, temperature 99 F (37.2 C), height 6\' 1"  (1.854 m), weight 290 lb 6.4 oz (131.725 kg). Alert and oriented. Skin warm and dry. Oral mucosa is moist.   . Sclera anicteric, conjunctivae is pink. Thyroid not enlarged. No cervical lymphadenopathy. Lungs clear. Heart regular rate and rhythm.  Abdomen is soft. Bowel sounds are positive. No hepatomegaly. No abdominal masses felt. No tenderness.  No edema to lower extremities.          Assessment & Plan:  Rectal bleeding. Colonic neoplasm needs to be ruled out. Polyp, hemorrhoids in the differential. Diagnostic colonoscopy. The risks and  benefits such as perforation, bleeding, and infection were reviewed with the patient and is agreeable.

## 2014-07-23 NOTE — Telephone Encounter (Signed)
Patient needs trilyte 

## 2014-07-23 NOTE — Patient Instructions (Addendum)
Colonoscopy.  The risks and benefits such as perforation, bleeding, and infection were reviewed with the patient and is agreeable. 

## 2014-07-24 ENCOUNTER — Encounter (INDEPENDENT_AMBULATORY_CARE_PROVIDER_SITE_OTHER): Payer: Self-pay

## 2014-07-24 MED ORDER — PEG 3350-KCL-NA BICARB-NACL 420 G PO SOLR: 4000.0000 mL | Freq: Once | ORAL | Status: DC

## 2014-08-13 ENCOUNTER — Ambulatory Visit (HOSPITAL_COMMUNITY)
Admission: RE | Admit: 2014-08-13 | Discharge: 2014-08-13 | Disposition: A | Discharge status: Home / Self Care | Payer: BLUE CROSS/BLUE SHIELD | Source: Ambulatory Visit | Attending: Internal Medicine | Admitting: Internal Medicine

## 2014-08-13 ENCOUNTER — Encounter (HOSPITAL_COMMUNITY)
Admission: RE | Disposition: A | Discharge status: Home / Self Care | Payer: Self-pay | Source: Ambulatory Visit | Attending: Internal Medicine

## 2014-08-13 ENCOUNTER — Encounter (HOSPITAL_COMMUNITY): Payer: Self-pay | Admitting: *Deleted

## 2014-08-13 DIAGNOSIS — C44329 Squamous cell carcinoma of skin of other parts of face: Secondary | ICD-10-CM | POA: Insufficient documentation

## 2014-08-13 DIAGNOSIS — K921 Melena: Secondary | ICD-10-CM | POA: Insufficient documentation

## 2014-08-13 DIAGNOSIS — K648 Other hemorrhoids: Secondary | ICD-10-CM | POA: Diagnosis not present

## 2014-08-13 DIAGNOSIS — Z87891 Personal history of nicotine dependence: Secondary | ICD-10-CM | POA: Insufficient documentation

## 2014-08-13 DIAGNOSIS — D125 Benign neoplasm of sigmoid colon: Secondary | ICD-10-CM | POA: Diagnosis not present

## 2014-08-13 DIAGNOSIS — K625 Hemorrhage of anus and rectum: Secondary | ICD-10-CM

## 2014-08-13 DIAGNOSIS — G4730 Sleep apnea, unspecified: Secondary | ICD-10-CM | POA: Insufficient documentation

## 2014-08-13 DIAGNOSIS — K644 Residual hemorrhoidal skin tags: Secondary | ICD-10-CM | POA: Diagnosis not present

## 2014-08-13 HISTORY — DX: Sleep apnea, unspecified: G47.30

## 2014-08-13 HISTORY — PX: COLONOSCOPY: SHX5424

## 2014-08-13 SURGERY — COLONOSCOPY
Anesthesia: Moderate Sedation

## 2014-08-13 MED ORDER — MIDAZOLAM HCL 5 MG/5ML IJ SOLN
INTRAMUSCULAR | Status: AC
  Filled 2014-08-13: qty 10

## 2014-08-13 MED ORDER — MEPERIDINE HCL 50 MG/ML IJ SOLN
INTRAMUSCULAR | Status: AC
  Filled 2014-08-13: qty 1

## 2014-08-13 MED ORDER — MIDAZOLAM HCL 5 MG/5ML IJ SOLN
INTRAMUSCULAR | Status: DC | PRN
  Administered 2014-08-13 (×5): 2 mg via INTRAVENOUS

## 2014-08-13 MED ORDER — SODIUM CHLORIDE 0.9 % IV SOLN
INTRAVENOUS | Status: DC
  Administered 2014-08-13: 08:00:00 via INTRAVENOUS

## 2014-08-13 MED ORDER — MEPERIDINE HCL 50 MG/ML IJ SOLN
INTRAMUSCULAR | Status: DC | PRN
  Administered 2014-08-13 (×2): 25 mg via INTRAVENOUS

## 2014-08-13 MED ORDER — STERILE WATER FOR IRRIGATION IR SOLN
Status: DC | PRN
  Administered 2014-08-13: 08:00:00

## 2014-08-13 NOTE — Discharge Instructions (Signed)
No aspirin or NSAIDs for 1 week. Resume usual diet. No driving for 24 hours. Physician will call with biopsy results.   Colonoscopy, Care After These instructions give you information on caring for yourself after your procedure. Your doctor may also give you more specific instructions. Call your doctor if you have any problems or questions after your procedure. HOME CARE  Do not drive for 24 hours.  Do not sign important papers or use machinery for 24 hours.  You may shower.  You may go back to your usual activities, but go slower for the first 24 hours.  Take rest breaks often during the first 24 hours.  Walk around or use warm packs on your belly (abdomen) if you have belly cramping or gas.  Drink enough fluids to keep your pee (urine) clear or pale yellow.  Resume your normal diet. Avoid heavy or fried foods.  Avoid drinking alcohol for 24 hours or as told by your doctor.  Only take medicines as told by your doctor. If a tissue sample (biopsy) was taken during the procedure:   Do not take aspirin or blood thinners for 7 days, or as told by your doctor.  Do not drink alcohol for 7 days, or as told by your doctor.  Eat soft foods for the first 24 hours. GET HELP IF: You still have a small amount of blood in your poop (stool) 2-3 days after the procedure. GET HELP RIGHT AWAY IF:  You have more than a small amount of blood in your poop.  You see clumps of tissue (blood clots) in your poop.  Your belly is puffy (swollen).  You feel sick to your stomach (nauseous) or throw up (vomit).  You have a fever.  You have belly pain that gets worse and medicine does not help. MAKE SURE YOU:  Understand these instructions.  Will watch your condition.  Will get help right away if you are not doing well or get worse. Document Released: 01/28/2010 Document Revised: 12/31/2012 Document Reviewed: 09/02/2012 [facility] Patient Information 2015 [place], [place]. This information  is not intended to replace advice given to you by your health care provider. Make sure you discuss any questions you have with your health care provider.  Colon Polyps Polyps are lumps of extra tissue growing inside the body. Polyps can grow in the large intestine (colon). Most colon polyps are noncancerous (benign). However, some colon polyps can become cancerous over time. Polyps that are larger than a pea may be harmful. To be safe, caregivers remove and test all polyps. CAUSES  Polyps form when mutations in the genes cause your cells to grow and divide even though no more tissue is needed. RISK FACTORS There are a number of risk factors that can increase your chances of getting colon polyps. They include:  Being older than 50 years.  Family history of colon polyps or colon cancer.  Long-term colon diseases, such as colitis or Crohn disease.  Being overweight.  Smoking.  Being inactive.  Drinking too much alcohol. SYMPTOMS  Most small polyps do not cause symptoms. If symptoms are present, they may include:  Blood in the stool. The stool may look dark red or black.  Constipation or diarrhea that lasts longer than 1 week. DIAGNOSIS People often do not know they have polyps until their caregiver finds them during a regular checkup. Your caregiver can use 4 tests to check for polyps:  Digital rectal exam. The caregiver wears gloves and feels inside the rectum. This  test would find polyps only in the rectum.  Barium enema. The caregiver puts a liquid called barium into your rectum before taking X-rays of your colon. Barium makes your colon look white. Polyps are dark, so they are easy to see in the X-ray pictures.  Sigmoidoscopy. A thin, flexible tube (sigmoidoscope) is placed into your rectum. The sigmoidoscope has a light and tiny camera in it. The caregiver uses the sigmoidoscope to look at the last third of your colon.  Colonoscopy. This test is like sigmoidoscopy, but the  caregiver looks at the entire colon. This is the most common method for finding and removing polyps. TREATMENT  Any polyps will be removed during a sigmoidoscopy or colonoscopy. The polyps are then tested for cancer. PREVENTION  To help lower your risk of getting more colon polyps:  Eat plenty of fruits and vegetables. Avoid eating fatty foods.  Do not smoke.  Avoid drinking alcohol.  Exercise every day.  Lose weight if recommended by your caregiver.  Eat plenty of calcium and folate. Foods that are rich in calcium include milk, cheese, and broccoli. Foods that are rich in folate include chickpeas, kidney beans, and spinach. HOME CARE INSTRUCTIONS Keep all follow-up appointments as directed by your caregiver. You may need periodic exams to check for polyps. SEEK MEDICAL CARE IF: You notice bleeding during a bowel movement. Document Released: 09/22/2003 Document Revised: 03/20/2011 Document Reviewed: 03/07/2011 [facility] Patient Information 2015 [place], [place]. This information is not intended to replace advice given to you by your health care provider. Make sure you discuss any questions you have with your health care provider.  Hemorrhoids Hemorrhoids are swollen veins around the rectum or anus. There are two types of hemorrhoids:   Internal hemorrhoids. These occur in the veins just inside the rectum. They may poke through to the outside and become irritated and painful.  External hemorrhoids. These occur in the veins outside the anus and can be felt as a painful swelling or hard lump near the anus. CAUSES  Pregnancy.   Obesity.   Constipation or diarrhea.   Straining to have a bowel movement.   Sitting for long periods on the toilet.  Heavy lifting or other activity that caused you to strain.  Anal intercourse. SYMPTOMS   Pain.   Anal itching or irritation.   Rectal bleeding.   Fecal leakage.   Anal swelling.   One or more lumps around the anus.   DIAGNOSIS  Your caregiver may be able to diagnose hemorrhoids by visual examination. Other examinations or tests that may be performed include:   Examination of the rectal area with a gloved hand (digital rectal exam).   Examination of anal canal using a small tube (scope).   A blood test if you have lost a significant amount of blood.  A test to look inside the colon (sigmoidoscopy or colonoscopy). TREATMENT Most hemorrhoids can be treated at home. However, if symptoms do not seem to be getting better or if you have a lot of rectal bleeding, your caregiver may perform a procedure to help make the hemorrhoids get smaller or remove them completely. Possible treatments include:   Placing a rubber band at the base of the hemorrhoid to cut off the circulation (rubber band ligation).   Injecting a chemical to shrink the hemorrhoid (sclerotherapy).   Using a tool to burn the hemorrhoid (infrared light therapy).   Surgically removing the hemorrhoid (hemorrhoidectomy).   Stapling the hemorrhoid to block blood flow to the tissue (hemorrhoid  stapling).  HOME CARE INSTRUCTIONS   Eat foods with fiber, such as whole grains, beans, nuts, fruits, and vegetables. Ask your doctor about taking products with added fiber in them (fibersupplements).  Increase fluid intake. Drink enough water and fluids to keep your urine clear or pale yellow.   Exercise regularly.   Go to the bathroom when you have the urge to have a bowel movement. Do not wait.   Avoid straining to have bowel movements.   Keep the anal area dry and clean. Use wet toilet paper or moist towelettes after a bowel movement.   Medicated creams and suppositories may be used or applied as directed.   Only take over-the-counter or prescription medicines as directed by your caregiver.   Take warm sitz baths for 15-20 minutes, 3-4 times a day to ease pain and discomfort.   Place ice packs on the hemorrhoids if they are  tender and swollen. Using ice packs between sitz baths may be helpful.   Put ice in a plastic bag.   Place a towel between your skin and the bag.   Leave the ice on for 15-20 minutes, 3-4 times a day.   Do not use a donut-shaped pillow or sit on the toilet for long periods. This increases blood pooling and pain.  SEEK MEDICAL CARE IF:  You have increasing pain and swelling that is not controlled by treatment or medicine.  You have uncontrolled bleeding.  You have difficulty or you are unable to have a bowel movement.  You have pain or inflammation outside the area of the hemorrhoids. MAKE SURE YOU:  Understand these instructions.  Will watch your condition.  Will get help right away if you are not doing well or get worse. Document Released: 12/24/1999 Document Revised: 12/13/2011 Document Reviewed: 10/31/2011 [facility] Patient Information 2015 [place], [place]. This information is not intended to replace advice given to you by your health care provider. Make sure you discuss any questions you have with your health care provider.

## 2014-08-13 NOTE — Op Note (Signed)
COLONOSCOPY PROCEDURE REPORT  PATIENT:  Bryce Lewis  MR#:  [id number] Birthdate:  Dec 23, 1960, 54 y.o., male Endoscopist:  Dr. Rogene Houston, MD Referred By:  Dr. Wende Neighbors, MD Procedure Date: 08/13/2014  Procedure:   Colonoscopy  Indications:  Patient is 54 year old Caucasian male who had two episodes of rectal bleeding recently normal bowel movements. He is undergoing diagnostic colonoscopy. Family history is negative for CRC.  Informed Consent:  The procedure and risks were reviewed with the patient and informed consent was obtained.  Medications:  Demerol 50 mg IV Versed 10 mg IV  Description of procedure:  After a digital rectal exam was performed, that colonoscope was advanced from the anus through the rectum and colon to the area of the cecum, ileocecal valve and appendiceal orifice. The cecum was deeply intubated. These structures were well-seen and photographed for the record. From the level of the cecum and ileocecal valve, the scope was slowly and cautiously withdrawn. The mucosal surfaces were carefully surveyed utilizing scope tip to flexion to facilitate fold flattening as needed. The scope was pulled down into the rectum where a thorough exam including retroflexion was performed.  Findings:   Prep excellent. Normal mucosa of cecum, ascending colon, hepatic flexure, transverse colon, splenic flexure and descending colon. 2 small polyps ablated via cold biopsy from mid sigmoid colon and submitted together. 6 mm broad-based polyp hot snared from sigmoid colon and submitted separately. Normal rectal mucosa. Hemorrhoids noted above and below the dentate line.   Therapeutic/Diagnostic Maneuvers Performed:  See above  Complications:  None  Cecal Withdrawal Time:  23 minutes  Impression:  Examination performed to cecum. Two small polyps ablated via cold biopsy from sigmoid colon and submitted together. 6 mm polyp hot snared from sigmoid colon. Internal/external  hemorrhoids felt to be source of patient's recent hematochezia.  Recommendations:  Standard instructions given. No aspirin or NSAIDs for 1 week. I will contact patient with biopsy results and further recommendations.  Whit Bruni U  08/13/2014 9:14 AM  CC: Dr. Delphina Cahill, MD & Dr. Rayne Du ref. provider found

## 2014-08-13 NOTE — H&P (Signed)
Bryce Lewis is an 54 y.o. male.   Chief Complaint: Patient is here for colonoscopy. HPI: Patient is 54 year old Caucasian male was here for diagnostic colonoscopy. He had 2 episodes of rectal bleeding with this bowel movements few weeks ago. He denies abdominal pain diarrhea or constipation. Personal history significant for squamous cell carcinoma along the right side of face requiring wide excision and skin graft in September 2013. History is negative for CRC.  Past Medical History  Diagnosis Date  . Cancer     Bone cancer as a child. Squamous to face  . Sleep disturbance 06/23/2013  . Sleep apnea     CPAP 7    Past Surgical History  Procedure Laterality Date  . Squamous cell carcinoma to rt side of face removed at [facility]      Family History  Problem Relation Age of Onset  . Hypertension Mother   . Parkinson's disease Mother   . Heart disease Father    Social History:  reports that he has quit smoking. He has never used smokeless tobacco. He reports that he drinks alcohol. He reports that he does not use illicit drugs.  Allergies:  Allergies  Allergen Reactions  . Lansoprazole     Medications Prior to Admission  Medication Sig Dispense Refill  . ibuprofen (ADVIL,MOTRIN) 200 MG tablet Take 200 mg by mouth every 6 (six) hours as needed for moderate pain.     . polyethylene glycol-electrolytes (NULYTELY/GOLYTELY) 420 G solution Take 4,000 mLs by mouth once. 4000 mL 0    No results found for this or any previous visit (from the past 48 hour(s)). No results found.  ROS  Blood pressure 131/89, pulse 71, temperature 98.3 F (36.8 C), temperature source Oral, resp. rate 11, SpO2 100 %. Physical Exam  Constitutional: He appears well-developed and well-nourished.  HENT:  Mouth/Throat: Oropharynx is clear and moist.  Skin graft along left side of face.  Eyes: Conjunctivae are normal. No scleral icterus.  Neck: No thyromegaly present.  Cardiovascular: Normal rate, regular  rhythm and normal heart sounds.   No murmur heard. Respiratory: Effort normal and breath sounds normal.  GI: Soft. He exhibits no distension and no mass. There is no tenderness.  Musculoskeletal: He exhibits no edema.  Lymphadenopathy:    He has no cervical adenopathy.  Neurological: He is alert.  Skin: Skin is warm and dry.     Assessment/Plan Hematochezia. Diagnostic colonoscopy.  Rhylei Mcquaig U 08/13/2014, 8:17 AM

## 2014-08-17 ENCOUNTER — Encounter (HOSPITAL_COMMUNITY): Payer: Self-pay | Admitting: Internal Medicine

## 2014-08-18 ENCOUNTER — Encounter (INDEPENDENT_AMBULATORY_CARE_PROVIDER_SITE_OTHER): Payer: Self-pay | Admitting: *Deleted

## 2014-12-22 ENCOUNTER — Ambulatory Visit: Payer: Self-pay | Admitting: Neurology

## 2015-01-20 ENCOUNTER — Ambulatory Visit (INDEPENDENT_AMBULATORY_CARE_PROVIDER_SITE_OTHER): Payer: BLUE CROSS/BLUE SHIELD | Admitting: Neurology

## 2015-01-20 ENCOUNTER — Encounter: Payer: Self-pay | Admitting: Neurology

## 2015-01-20 VITALS — BP 140/78 | HR 70 | Resp 18 | Ht 73.0 in | Wt 297.0 lb

## 2015-01-20 DIAGNOSIS — E669 Obesity, unspecified: Secondary | ICD-10-CM

## 2015-01-20 DIAGNOSIS — G4733 Obstructive sleep apnea (adult) (pediatric): Secondary | ICD-10-CM

## 2015-01-20 NOTE — Progress Notes (Signed)
Subjective:    Patient ID: DESMUND KUPER is a 56 y.o. male.  HPI     Interim history:  Mr. Brodie is a 55 year old right-handed gentleman with an underlying medical history of osteomyelitis as a child, SCC, s/p skin grafting in 2012 and obesity, who presents for followup consultation of his obstructive sleep apnea treated with CPAP at home. He is unaccompanied today. I last saw him on 04/21/2014, at which time he reported issues with nasal congestion and difficulty tolerating his CPAP. He was taking over-the-counter Flonase and Zyrtec. I suggested that he try nasal saline rinses as well. I encouraged him to be compliant with treatment. He had some sinus related headaches as well. He had gained weight.   Today, 01/20/2015: I reviewed his CPAP compliance data from 12/20/2014 through 01/18/2015 which is a total of 30 days during which time he used his machine only 12 days with percent used days greater than 4 hours at 17%, indicating poor compliance with an average usage of 1 hour and 33 minutes, residual AHI 1.6 per hour, leak acceptable with the 95th percentile at 16.3 L/m on a pressure of 8 cm with EPR of 3.  Today, 01/20/2015: He reports doing about the same, some weight gain after the holidays, not much better with his CPAP compliance, per his report, due to laziness. He does admit, that when he does use CPAP, he wakes up better rested. He has had no interim medication changes or medical problems. His wife works for a Pharmacist, community and he has looked at his dental alignment and did not think patient was a candidate for a dental appliance. He is not keen on any surgical evaluation for sleep apnea. He knows he needs to lose weight. He has ordered CPAP supplies recently.  Previously:  I saw him on 10/28/2013, at which time he reported doing better with positive airway pressure treatment. He felt that his biggest improvement was the absence of morning headaches. He was very pleased with that. He had some  difficulty adjusting in the very beginning but since then had been doing fine. He was on AutoPap at the time and change that to a set pressure of 8 cm at his last visit.   I reviewed his CPAP compliance data from 03/21/2014 through 04/19/2014 which is a total of 30 days during which time he used his machine only 18 days with percent used days greater than 4 hours of only 47%, indicating suboptimal compliance with an average usage for days on machine at 4 hours and 53 minutes and for all days average usage was 2 hours and 56 minutes only. Residual AHI good at 1.1 per hour and leak acceptable with the 95th percentile at 12.9 L/m on a set pressure of 8 cm with EPR of 3.  I saw him on 09/03/2013, at which time we talked about his sleep study results and I suggested a trial of AutoPap and a closer followup with compliance data.    I reviewed his compliance data from 09/27/2013 to 10/26/2013 which is a total of 30 days during which time he used his machine 26 days. Percent most days greater than 4 hours was 23 days, which is 77%, adequate compliance. Pressure ranges from 6-13 cm with 95th percentile at 7.7 cm EPR of 3. Leak was low with 95th percentile at 3.3 L per minute, residual AHI level at 1.1 per hour, average usage only 4 hours and 49 minutes for all days and 5 hours and 33 minutes  for days used.  I first saw him on 06/23/2013 at the request of his primary care physician, at which time he reported an abnormal overnight pulse oximetry test which I reviewed. This was done at a time when he suffered from a cough and congestion with body aches reported at the time. His pulse oximetry test from 10/25/2012 showed a mean oxygen saturation of 92.1%, a nadir of 84% and time in minutes below the saturation of 88% of 6 minutes and 52 seconds. He reported daytime sleepiness, snoring and morning HAs. I suggested he return for sleep study. He had a baseline sleep study on 08/01/2013. His sleep efficiency was normal at  90.3% with a latency to sleep normal at 14 minutes. Wake after sleep onset was 27.5 minutes with moderate sleep fragmentation noted. He had a normal arousal index. He had a normal percentage of light stage sleep, and normal percentage of deep sleep, and a normal percentage of REM sleep, with a normal REM latency. He had no significant PLMs. He had no significant EKG changes. Mild to moderate snoring was noted. He slept mostly on his back. His total AHI was 9.5 per hour rising to 29.4 per hour in REM sleep, and 13 per hour in the supine position. Baseline oxygen saturation was 91% with a nadir of 80% during REM sleep period time below 88% saturation was 12 minutes and 40 seconds.    His Past Medical History Is Significant For: Past Medical History  Diagnosis Date  . Cancer [facility])     Bone cancer as a child. Squamous to face  . Sleep disturbance 06/23/2013  . Sleep apnea     CPAP 7    His Past Surgical History Is Significant For: Past Surgical History  Procedure Laterality Date  . Squamous cell carcinoma to rt side of face removed at [facility]    . Colonoscopy N/A 08/13/2014    Procedure: COLONOSCOPY;  Surgeon: Rogene Houston, MD;  Location[facility];  Service: Endoscopy;  Laterality: N/A;  220 - moved to 8:30 - Ann notified pt    His Family History Is Significant For: Family History  Problem Relation Age of Onset  . Hypertension Mother   . Parkinson's disease Mother   . Heart disease Father     His Social History Is Significant For: Social History   Social History  . Marital Status: Married    Spouse Name: Lawerance Bach  . Number of Children: 2  . Years of Education: 12   Occupational History  .      [place]   Social History Main Topics  . Smoking status: Former Research scientist (life sciences)  . Smokeless tobacco: Never Used  . Alcohol Use: Yes  . Drug Use: No  . Sexual Activity: Not Asked   Other Topics Concern  . None   Social History Narrative   Consumes a couple cups of caffeine  daily, right handed    His Allergies Are:  Allergies  Allergen Reactions  . Lansoprazole   :   His Current Medications Are:  Outpatient Encounter Prescriptions as of 01/20/2015  Medication Sig  . ibuprofen (ADVIL,MOTRIN) 200 MG tablet Take 200 mg by mouth every 6 (six) hours as needed for moderate pain.    No facility-administered encounter medications on file as of 01/20/2015.  :  Review of Systems:  Out of a complete 14 point review of systems, all are reviewed and negative with the exception of these symptoms as listed below:   Review  of Systems  Neurological:       Patient feels like he is doing ok with CPAP. He has had some trouble with the head strap but ordered a new one yesterday.     Objective:  Neurologic Exam  Physical Exam Physical Examination:   Filed Vitals:   01/20/15 1608  BP: 140/78  Pulse: 70  Resp: 18   General Examination: The patient is a very pleasant 55 y.o. male in no acute distress. He appears well-developed and well-nourished and well groomed. He is obese. His weight has increased.  HEENT: Normocephalic, atraumatic, pupils are equal, round and reactive to light and accommodation. Extraocular tracking is good without limitation to gaze excursion or nystagmus noted. Normal smooth pursuit is noted. Hearing is grossly intact. Face is asymmetric with R facial skin graft, unchanged and unremarkable, with decreased sensation on his skin graft, otherwise normal facial animation and normal facial sensation. Speech is clear with no dysarthria noted. There is no hypophonia. There is no lip, neck/head, jaw or voice tremor. Neck is supple with full range of passive and active motion. There are no carotid bruits on auscultation. Oropharynx exam reveals: mild mouth dryness, adequate dental hygiene and moderate airway crowding, due to larger tongue and redundant soft palate and smaller airway entry and tonsils in place. Mallampati is class II. Tongue protrudes centrally  and palate elevates symmetrically. He has a minimal to absent overbite.   Chest: Clear to auscultation without wheezing, rhonchi or crackles noted.  Heart: S1+S2+0, regular and normal without murmurs, rubs or gallops noted.   Abdomen: Soft, non-tender and non-distended with normal bowel sounds appreciated on auscultation.  Extremities: There is no pitting edema in the distal lower extremities bilaterally.   Skin: Warm and dry without trophic changes noted. There are a mild degree of varicose veins in the left calf, which are unchanged from before.  Musculoskeletal: exam reveals no obvious joint deformities, tenderness or joint swelling or erythema.   Neurologically:  Mental status: The patient is awake, alert and oriented in all 4 spheres. His immediate and remote memory, attention, language skills and fund of knowledge are appropriate. There is no evidence of aphasia, agnosia, apraxia or anomia. Speech is clear with normal prosody and enunciation. Thought process is linear. Mood is normal and affect is normal.  Cranial nerves II - XII are as described above under HEENT exam. In addition: shoulder shrug is normal with equal shoulder height noted. Motor exam: Normal bulk, strength and tone is noted. There is no drift, tremor or rebound. Romberg is negative. Reflexes are 1-2+ throughout. Fine motor skills and coordination: intact with normal finger taps, normal hand movements, normal rapid alternating patting, normal foot taps and normal foot agility.  Cerebellar testing: No dysmetria or intention tremor on finger to nose testing. Heel to shin is unremarkable bilaterally. There is no truncal or gait ataxia.  Sensory exam: intact to light touch in the upper and lower extremities.  Gait, station and balance: He stands easily. No veering to one side is noted. No leaning to one side is noted. Posture is age-appropriate and stance is narrow based. Gait shows normal stride length and normal pace. No  problems turning are noted. He turns en bloc. Tandem walk is unremarkable.            Assessment and Plan:   In summary, MOSHEH EYMARD is a very pleasant 55 year old male with an underlying medical history of osteomyelitis as a child, SCC, s/p surgery and skin  grafting in 2012, nasal congestion, and obesity, with abnormal overnight pulse oximetry test in October 2014, who presents for followup consultation of his obstructive sleep apnea, mild overall and with moderate REM related OSA as per diagnostic sleep study on 08/01/13 sleep. After that he was tried on AutoPap and then switched to CPAP at 8 cm. His compliance is not ideal. It fluctuates. He has had overall mediocre compliance and poor currently. He admits that he is not disciplined about using CPAP. He is motivated to try it again. He is advised about alternative treatment options which are limited at this time. He's not a great candidate for a dental appliance. He is not keen on being evaluated for surgical options. Weight loss is an ongoing struggle. At this juncture, I suggested that we reevaluate things in 6 months, he may benefit from a full night CPAP titration study for proper titration and mask fitting. We will revisit this idea at her next appointment. I will see him back for follow-up in about 6 months, sooner if needed. I answered all his questions today and he was in agreement. His exam is stable other than recent weight gain.   I spent 15 minutes in total face-to-face time with the patient, more than 50% of which was spent in counseling and coordination of care, reviewing test results, reviewing medication and discussing or reviewing the diagnosis of OSA, its prognosis and treatment options.

## 2015-01-20 NOTE — Patient Instructions (Signed)
Please continue using your CPAP regularly. While your insurance requires that you use CPAP at least 4 hours each night on 70% of the nights, I recommend, that you not skip any nights and use it throughout the night if you can. Getting used to CPAP and staying with the treatment long term does take time and patience and discipline. Untreated obstructive sleep apnea when it is moderate to severe can have an adverse impact on cardiovascular health and raise her risk for heart disease, arrhythmias, hypertension, congestive heart failure, stroke and diabetes. Untreated obstructive sleep apnea causes sleep disruption, nonrestorative sleep, and sleep deprivation. This can have an impact on your day to day functioning and cause daytime sleepiness and impairment of cognitive function, memory loss, mood disturbance, and problems focussing. Using CPAP regularly can improve these symptoms.  We will reevaluate things in 6 months, work on weight loss and we may consider a full night sleep study with CPAP titration later this year.

## 2015-07-21 ENCOUNTER — Ambulatory Visit: Payer: BLUE CROSS/BLUE SHIELD | Admitting: Neurology

## 2015-09-15 DIAGNOSIS — C4490 Unspecified malignant neoplasm of skin, unspecified: Secondary | ICD-10-CM | POA: Diagnosis not present

## 2016-09-20 DIAGNOSIS — C4490 Unspecified malignant neoplasm of skin, unspecified: Secondary | ICD-10-CM | POA: Diagnosis not present

## 2017-06-14 DIAGNOSIS — Z0000 Encounter for general adult medical examination without abnormal findings: Secondary | ICD-10-CM | POA: Diagnosis not present

## 2017-06-14 DIAGNOSIS — D72829 Elevated white blood cell count, unspecified: Secondary | ICD-10-CM | POA: Diagnosis not present

## 2017-06-14 DIAGNOSIS — R7301 Impaired fasting glucose: Secondary | ICD-10-CM | POA: Diagnosis not present

## 2017-06-14 DIAGNOSIS — B349 Viral infection, unspecified: Secondary | ICD-10-CM | POA: Diagnosis not present

## 2017-10-15 ENCOUNTER — Encounter: Payer: Self-pay | Admitting: Orthopedic Surgery

## 2017-10-15 ENCOUNTER — Ambulatory Visit (HOSPITAL_COMMUNITY)
Admission: RE | Admit: 2017-10-15 | Discharge: 2017-10-15 | Disposition: A | Discharge status: Home / Self Care | Payer: BLUE CROSS/BLUE SHIELD | Source: Ambulatory Visit | Attending: Orthopedic Surgery | Admitting: Orthopedic Surgery

## 2017-10-15 ENCOUNTER — Ambulatory Visit: Payer: BLUE CROSS/BLUE SHIELD | Admitting: Orthopedic Surgery

## 2017-10-15 VITALS — BP 135/79 | HR 81 | Ht 73.0 in | Wt 299.0 lb

## 2017-10-15 DIAGNOSIS — M25561 Pain in right knee: Secondary | ICD-10-CM

## 2017-10-15 DIAGNOSIS — M1711 Unilateral primary osteoarthritis, right knee: Secondary | ICD-10-CM

## 2017-10-15 NOTE — Patient Instructions (Signed)

## 2017-10-15 NOTE — Progress Notes (Signed)
NEW PATIENT OFFICE VISI  Chief Complaint  Patient presents with  . Knee Pain    Right knee pain, no injury.    57 year old male with history of atraumatic onset of right knee pain which she treated with ice rest activity modification bracing and the pain went away  Initially the pain was located over the medial aspect of the right knee he had it for about 2 weeks it was dull it was mild to moderate and it was associated with some swelling   Review of Systems  Constitutional: Negative for chills, fever and weight loss.  Respiratory: Negative for shortness of breath.   Cardiovascular: Negative for chest pain.  Neurological: Negative for tingling.     Past Medical History:  Diagnosis Date  . Cancer [facility])    Bone cancer as a child. Squamous to face  . Sleep apnea    CPAP 7  . Sleep disturbance 06/23/2013    Past Surgical History:  Procedure Laterality Date  . COLONOSCOPY N/A 08/13/2014   Procedure: COLONOSCOPY;  Surgeon: Rogene Houston, MD;  Location[facility];  Service: Endoscopy;  Laterality: N/A;  220 - moved to 8:30 - Ann notified pt  . squamous cell carcinoma to rt side of face removed at [facility]      Family History  Problem Relation Age of Onset  . Hypertension Mother   . Parkinson's disease Mother   . Heart disease Father   . Cancer Brother    Social History   Tobacco Use  . Smoking status: Former Research scientist (life sciences)  . Smokeless tobacco: Never Used  Substance Use Topics  . Alcohol use: Yes  . Drug use: No    Allergies  Allergen Reactions  . Lansoprazole     No outpatient medications have been marked as taking for the 10/15/17 encounter (Office Visit) with Carole Civil, MD.    BP 135/79   Pulse 81   Ht 6\' 1"  (1.854 m)   Wt 299 lb (135.6 kg)   BMI 39.45 kg/m   Physical Exam  Constitutional: He is oriented to person, place, and time. He appears well-developed and well-nourished.  Vital signs have been reviewed and are stable. Gen. appearance the  patient is well-developed and well-nourished with normal grooming and hygiene.   Musculoskeletal:       Right knee: He exhibits no effusion.       Left knee: He exhibits no effusion.  Neurological: He is alert and oriented to person, place, and time.  Skin: Skin is warm and dry. No erythema.  Psychiatric: He has a normal mood and affect.  Vitals reviewed.   Right Knee Exam   Muscle Strength  The patient has normal right knee strength.  Tenderness  The patient is experiencing tenderness in the medial joint line, medial retinaculum and patella.  Range of Motion  Extension: normal  Flexion: normal   Tests  McMurray:  Medial - negative Lateral - negative Varus: negative Valgus: negative Drawer:  Anterior - negative    Posterior - negative  Other  Erythema: absent Scars: absent Sensation: normal Pulse: present Swelling: none Effusion: no effusion present   Left Knee Exam   Muscle Strength  The patient has normal left knee strength.  Tenderness  The patient is experiencing no tenderness.   Range of Motion  Extension: normal  Flexion: normal   Tests  McMurray:  Medial - negative Lateral - negative Varus: negative Valgus: negative Drawer:  Anterior - negative  Posterior - negative  Other  Erythema: absent Scars: absent Sensation: normal Pulse: present Swelling: none Effusion: no effusion present        MEDICAL DECISION SECTION  Xrays were done at [facility]  My independent reading of xrays:  AP lateral and sunrise view right knee x-ray shows some joint space narrowing medially normal alignment no secondary bone changes axial patella view shows slight narrowing of the medial patellar femoral joint  Impression osteoarthritis mild right knee  Encounter Diagnoses  Name Primary?  . Acute pain of right knee Yes  . Primary osteoarthritis of right knee     PLAN: (Rx., injectx, surgery, frx, mri/ct)  We advised him to keep his weight down  and exercise and try to keep his knee strong   No orders of the defined types were placed in this encounter.   Arther Abbott, MD  10/15/2017 3:42 PM

## 2018-05-28 ENCOUNTER — Other Ambulatory Visit: Payer: Self-pay | Admitting: Dermatology

## 2018-05-28 DIAGNOSIS — D044 Carcinoma in situ of skin of scalp and neck: Secondary | ICD-10-CM | POA: Diagnosis not present

## 2018-05-28 DIAGNOSIS — D0439 Carcinoma in situ of skin of other parts of face: Secondary | ICD-10-CM | POA: Diagnosis not present

## 2018-05-28 DIAGNOSIS — D485 Neoplasm of uncertain behavior of skin: Secondary | ICD-10-CM | POA: Diagnosis not present

## 2018-07-10 DIAGNOSIS — E782 Mixed hyperlipidemia: Secondary | ICD-10-CM | POA: Diagnosis not present

## 2018-07-10 DIAGNOSIS — R7301 Impaired fasting glucose: Secondary | ICD-10-CM | POA: Diagnosis not present

## 2018-07-11 DIAGNOSIS — R945 Abnormal results of liver function studies: Secondary | ICD-10-CM | POA: Diagnosis not present

## 2018-07-11 DIAGNOSIS — Z0001 Encounter for general adult medical examination with abnormal findings: Secondary | ICD-10-CM | POA: Diagnosis not present

## 2018-07-11 DIAGNOSIS — R7301 Impaired fasting glucose: Secondary | ICD-10-CM | POA: Diagnosis not present

## 2018-07-11 DIAGNOSIS — E782 Mixed hyperlipidemia: Secondary | ICD-10-CM | POA: Diagnosis not present

## 2018-07-11 DIAGNOSIS — D044 Carcinoma in situ of skin of scalp and neck: Secondary | ICD-10-CM | POA: Diagnosis not present

## 2018-07-11 DIAGNOSIS — D0439 Carcinoma in situ of skin of other parts of face: Secondary | ICD-10-CM | POA: Diagnosis not present

## 2018-11-27 ENCOUNTER — Other Ambulatory Visit: Payer: Self-pay | Admitting: Dermatology

## 2018-11-27 DIAGNOSIS — C4442 Squamous cell carcinoma of skin of scalp and neck: Secondary | ICD-10-CM | POA: Diagnosis not present

## 2019-01-14 DIAGNOSIS — J069 Acute upper respiratory infection, unspecified: Secondary | ICD-10-CM | POA: Diagnosis not present

## 2019-01-20 ENCOUNTER — Emergency Department (HOSPITAL_COMMUNITY)
Admission: EM | Admit: 2019-01-20 | Discharge: 2019-01-20 | Disposition: A | Discharge status: Home / Self Care | Payer: BC Managed Care – PPO | Attending: Emergency Medicine | Admitting: Emergency Medicine

## 2019-01-20 ENCOUNTER — Encounter (HOSPITAL_COMMUNITY): Payer: Self-pay | Admitting: Emergency Medicine

## 2019-01-20 ENCOUNTER — Other Ambulatory Visit: Payer: Self-pay

## 2019-01-20 ENCOUNTER — Emergency Department (HOSPITAL_COMMUNITY): Payer: BC Managed Care – PPO

## 2019-01-20 DIAGNOSIS — J1282 Pneumonia due to coronavirus disease 2019: Secondary | ICD-10-CM

## 2019-01-20 DIAGNOSIS — U071 COVID-19: Secondary | ICD-10-CM

## 2019-01-20 DIAGNOSIS — R0602 Shortness of breath: Secondary | ICD-10-CM | POA: Diagnosis not present

## 2019-01-20 DIAGNOSIS — R509 Fever, unspecified: Secondary | ICD-10-CM | POA: Diagnosis not present

## 2019-01-20 DIAGNOSIS — J189 Pneumonia, unspecified organism: Secondary | ICD-10-CM | POA: Diagnosis not present

## 2019-01-20 DIAGNOSIS — R05 Cough: Secondary | ICD-10-CM | POA: Diagnosis not present

## 2019-01-20 MED ORDER — PREDNISONE 20 MG PO TABS: 40.0000 mg | ORAL_TABLET | Freq: Every day | ORAL | 0 refills | Status: DC

## 2019-01-20 MED ORDER — ALBUTEROL SULFATE HFA 108 (90 BASE) MCG/ACT IN AERS
2.0000 | INHALATION_SPRAY | Freq: Once | RESPIRATORY_TRACT | Status: AC
  Administered 2019-01-20: 2 via RESPIRATORY_TRACT
  Filled 2019-01-20: qty 6.7

## 2019-01-20 MED ORDER — AEROCHAMBER PLUS FLO-VU MEDIUM MISC
1.0000 | Freq: Once | Status: AC
  Administered 2019-01-20: 1

## 2019-01-20 MED ORDER — PREDNISONE 20 MG PO TABS
40.0000 mg | ORAL_TABLET | Freq: Once | ORAL | Status: AC
  Administered 2019-01-20: 40 mg via ORAL
  Filled 2019-01-20: qty 2

## 2019-01-20 NOTE — ED Notes (Signed)
Pt verbalizes understanding of follow up care and medication regimen, verbalizes understanding of discharge instructions, and reports having the chance to have his questions answered

## 2019-01-20 NOTE — ED Notes (Signed)
Pt ambulated in room without difficulty. O2 sats ranged 92-94% on RA.

## 2019-01-20 NOTE — ED Triage Notes (Signed)
PT states he was diagnosed with Covid x7 days ago and has been symptomatic with fever of 100.4 at home with relief from tylenol. PT states his SOB and congested cough has gotten worse. PT 90% on room air.

## 2019-01-20 NOTE — Discharge Instructions (Signed)
Please take the albuterol inhaler, 2 puffs every 4 hours as needed for shortness of breath, take prednisone daily for 5 days.  Your x-ray shows the very start of a possible coronavirus pneumonia.  This should go away by itself but may take another week or 2.  If you are having mild shortness of breath, mild weakness, mild fevers, mild headache and you should be able to tolerate this at home however if you are noticing that your symptoms are worsening or becoming severe please come back to the hospital immediately.  Additionally if your oxygen levels are dropping low and staying low you may need to come back to the hospital as well.  If you are consistently below 91% I would recommend that you come back to the hospital for admission.

## 2019-01-20 NOTE — ED Provider Notes (Signed)
[facility] EMERGENCY DEPARTMENT Provider Note   CSN: [id number] Arrival date & time: 01/20/19  [id number]     History Chief Complaint  Patient presents with  . Cough    Bryce Lewis is a 59 y.o. male.  HPI   This patient is a pleasant 59 year old male, has a history of some sleep apnea but no other chronic medical conditions including no history of hypertension, diabetes cardiac or pulmonary disease.  He does report a remote history of tobacco use but no longer smokes and it has been many years.  He states that over the last 8 days he has had some progressive symptoms which have been persistent including fevers, chills, headache, body aches, shortness of breath and wheezing.  He does get short of breath with exertion but states that when he is at rest he is less short of breath.  He denies using any treatments at home in fact he has had essentially no medications other than over-the-counter medications which do not give any lasting relief.  Temperatures seem to be persistently in the 101 range.  He denies nausea vomiting or diarrhea and there was appetite is decreased he is still maintaining a adequate diet.  His wife is also been sick with similar symptoms.  He is known to be coronavirus positive, has had symptoms for 8 days.  Past Medical History:  Diagnosis Date  . Cancer [facility])    Bone cancer as a child. Squamous to face  . Sleep apnea    CPAP 7  . Sleep disturbance 06/23/2013    Patient Active Problem List   Diagnosis Date Noted  . Sleep disturbance 06/23/2013  . Family history of acute myocardial infarction 04/18/2011  . Family history of prostate cancer 04/18/2011    Past Surgical History:  Procedure Laterality Date  . COLONOSCOPY N/A 08/13/2014   Procedure: COLONOSCOPY;  Surgeon: Rogene Houston, MD;  Location: [facility];  Service: Endoscopy;  Laterality: N/A;  220 - moved to 8:30 - Ann notified pt  . squamous cell carcinoma to rt side of face removed at [facility]          Family History  Problem Relation Age of Onset  . Hypertension Mother   . Parkinson's disease Mother   . Heart disease Father   . Cancer Brother     Social History   Tobacco Use  . Smoking status: Former Research scientist (life sciences)  . Smokeless tobacco: Never Used  Substance Use Topics  . Alcohol use: Yes    Comment: occ  . Drug use: No    Home Medications Prior to Admission medications   Medication Sig Start Date End Date Taking? Authorizing Provider  ibuprofen (ADVIL,MOTRIN) 200 MG tablet Take 200 mg by mouth every 6 (six) hours as needed for moderate pain.     [provider]  predniSONE (DELTASONE) 20 MG tablet Take 2 tablets (40 mg total) by mouth daily. 01/20/19   Noemi Chapel, MD    Allergies    Lansoprazole  Review of Systems   Review of Systems  All other systems reviewed and are negative.   Physical Exam Updated Vital Signs BP 123/84   Pulse 82   Temp 99 F (37.2 C) (Oral)   Resp (!) 23   Ht 1.854 m (6\' 1" )   Wt 136.1 kg   SpO2 93%   BMI 39.58 kg/m   Physical Exam Vitals and nursing note reviewed.  Constitutional:      General: He is not in acute  distress.    Appearance: He is well-developed.  HENT:     Head: Normocephalic and atraumatic.     Mouth/Throat:     Mouth: Mucous membranes are moist.     Pharynx: Oropharynx is clear. No oropharyngeal exudate.  Eyes:     General: No scleral icterus.       Right eye: No discharge.        Left eye: No discharge.     Conjunctiva/sclera: Conjunctivae normal.     Pupils: Pupils are equal, round, and reactive to light.  Neck:     Thyroid: No thyromegaly.     Vascular: No JVD.  Cardiovascular:     Rate and Rhythm: Normal rate and regular rhythm.     Heart sounds: Normal heart sounds. No murmur. No friction rub. No gallop.   Pulmonary:     Effort: Pulmonary effort is normal. No respiratory distress.     Breath sounds: Wheezing present. No rales.     Comments: The patient speaks in full sentences, has  no tachypnea, has a slight expiratory wheeze Abdominal:     General: Bowel sounds are normal. There is no distension.     Palpations: Abdomen is soft. There is no mass.     Tenderness: There is no abdominal tenderness.  Musculoskeletal:        General: No tenderness. Normal range of motion.     Cervical back: Normal range of motion and neck supple.  Lymphadenopathy:     Cervical: No cervical adenopathy.  Skin:    General: Skin is warm and dry.     Findings: No erythema or rash.  Neurological:     Mental Status: He is alert.     Coordination: Coordination normal.  Psychiatric:        Behavior: Behavior normal.     ED Results / Procedures / Treatments   Labs (all labs ordered are listed, but only abnormal results are displayed) Labs Reviewed - No data to display  EKG None  Radiology DG Chest [facility] 1 View  Result Date: 01/20/2019 CLINICAL DATA:  Shortness of breath. COVID-19 positive. EXAM: PORTABLE CHEST 1 VIEW COMPARISON:  May 29, 2005. FINDINGS: Stable cardiomediastinal silhouette. No pneumothorax or pleural effusion is noted. Minimal patchy opacities are noted in both lung bases which may represent subsegmental atelectasis or possibly infiltrates. Bony thorax is unremarkable. IMPRESSION: Minimal patchy opacities are noted in both lung bases which may represent subsegmental atelectasis or possibly infiltrates. Follow-up radiographs are recommended. Electronically Signed   By: Marijo Conception M.D.   On: 01/20/2019 09:15    Procedures Procedures (including critical care time)  Medications Ordered in ED Medications  albuterol (VENTOLIN HFA) 108 (90 Base) MCG/ACT inhaler 2 puff (2 puffs Inhalation Given 01/20/19 0903)  AeroChamber Plus Flo-Vu Medium MISC 1 each (1 each Other Given 01/20/19 0903)  predniSONE (DELTASONE) tablet 40 mg (40 mg Oral Given 01/20/19 0901)    ED Course  I have reviewed the triage vital signs and the nursing notes.  Pertinent labs & imaging results that  were available during my care of the patient were reviewed by me and considered in my medical decision making (see chart for details).  Clinical Course as of Jan 20 1004  Mon Jan 20, 2019  [id number] The patient has been given breathing treatments, prednisone, ambulatory with sats above 93%, anticipate ability to discharge without need for inpatient treatment.  He is speaking in full sentences, feels better and understands the treatment course.  He is not febrile tachycardic or hypoxic to any significant degree and I have made him aware of the indications for return to which she has agreed.  At this time I feel the patient would be better served at home with medications as above.  He agrees with this plan.   [BM]    Clinical Course User Index [BM] Noemi Chapel, MD   MDM Rules/Calculators/A&P                      The patient's exam is consistent with having some wheezing likely related to Covid.  He does not have any rales, his oxygen ranges between 89 to 95% depending on his respiratory effort.  We will ambulate in the room to detect for hypoxia, chest x-ray to evaluate for lung burden of Covid, otherwise the patient appears to be in no distress at this time.  He will be given albuterol MDI treatments and some prednisone  JAQUINN HANSMAN was evaluated in Emergency Department on 01/20/2019 for the symptoms described in the history of present illness. He was evaluated in the context of the global COVID-19 pandemic, which necessitated consideration that the patient might be at risk for infection with the SARS-CoV-2 virus that causes COVID-19. Institutional protocols and algorithms that pertain to the evaluation of patients at risk for COVID-19 are in a state of rapid change based on information released by regulatory bodies including the CDC and federal and state organizations. These policies and algorithms were followed during the patient's care in the ED.   Final Clinical Impression(s) / ED  Diagnoses Final diagnoses:  Pneumonia due to COVID-19 virus    Rx / DC Orders ED Discharge Orders         Ordered    predniSONE (DELTASONE) 20 MG tablet  Daily     01/20/19 1005           Noemi Chapel, MD 01/20/19 1007

## 2019-01-21 DIAGNOSIS — G4733 Obstructive sleep apnea (adult) (pediatric): Secondary | ICD-10-CM | POA: Diagnosis not present

## 2019-01-21 DIAGNOSIS — U071 COVID-19: Secondary | ICD-10-CM | POA: Diagnosis not present

## 2019-01-21 DIAGNOSIS — J1289 Other viral pneumonia: Secondary | ICD-10-CM | POA: Diagnosis not present

## 2019-01-25 DIAGNOSIS — U071 COVID-19: Secondary | ICD-10-CM | POA: Diagnosis not present

## 2019-01-25 DIAGNOSIS — J1289 Other viral pneumonia: Secondary | ICD-10-CM | POA: Diagnosis not present

## 2019-01-25 DIAGNOSIS — G4733 Obstructive sleep apnea (adult) (pediatric): Secondary | ICD-10-CM | POA: Diagnosis not present

## 2020-02-04 ENCOUNTER — Other Ambulatory Visit: Payer: Self-pay

## 2020-02-04 ENCOUNTER — Ambulatory Visit: Payer: BC Managed Care – PPO | Admitting: Dermatology

## 2020-02-04 ENCOUNTER — Encounter: Payer: Self-pay | Admitting: Dermatology

## 2020-02-04 DIAGNOSIS — Z85828 Personal history of other malignant neoplasm of skin: Secondary | ICD-10-CM | POA: Diagnosis not present

## 2020-02-04 DIAGNOSIS — D485 Neoplasm of uncertain behavior of skin: Secondary | ICD-10-CM

## 2020-02-04 DIAGNOSIS — C44329 Squamous cell carcinoma of skin of other parts of face: Secondary | ICD-10-CM

## 2020-02-04 DIAGNOSIS — Z1283 Encounter for screening for malignant neoplasm of skin: Secondary | ICD-10-CM | POA: Diagnosis not present

## 2020-02-04 DIAGNOSIS — C44319 Basal cell carcinoma of skin of other parts of face: Secondary | ICD-10-CM

## 2020-02-04 DIAGNOSIS — L570 Actinic keratosis: Secondary | ICD-10-CM

## 2020-02-04 DIAGNOSIS — C4491 Basal cell carcinoma of skin, unspecified: Secondary | ICD-10-CM

## 2020-02-04 DIAGNOSIS — C44519 Basal cell carcinoma of skin of other part of trunk: Secondary | ICD-10-CM | POA: Diagnosis not present

## 2020-02-04 DIAGNOSIS — C4492 Squamous cell carcinoma of skin, unspecified: Secondary | ICD-10-CM

## 2020-02-04 DIAGNOSIS — D044 Carcinoma in situ of skin of scalp and neck: Secondary | ICD-10-CM | POA: Diagnosis not present

## 2020-02-04 DIAGNOSIS — Z8589 Personal history of malignant neoplasm of other organs and systems: Secondary | ICD-10-CM

## 2020-02-04 HISTORY — DX: Squamous cell carcinoma of skin, unspecified: C44.92

## 2020-02-04 HISTORY — DX: Basal cell carcinoma of skin, unspecified: C44.91

## 2020-02-04 NOTE — Patient Instructions (Signed)

## 2020-02-11 ENCOUNTER — Telehealth: Payer: Self-pay

## 2020-02-11 NOTE — Telephone Encounter (Signed)
Phone call to patient with his pathology results.  Path to patient. 

## 2020-02-11 NOTE — Telephone Encounter (Signed)
-----   Message from Lavonna Monarch, MD sent at 02/11/2020  5:16 AM EST ----- Schedule as last surgery of the morning or afternoon, decide at that time if any of these are best treated with Mohs.

## 2020-02-13 ENCOUNTER — Encounter: Payer: Self-pay | Admitting: Dermatology

## 2020-02-13 NOTE — Progress Notes (Signed)
Follow-Up Visit   Subjective  Bryce Lewis is a 60 y.o. male who presents for the following: Annual Exam (Scaly spots on scalp- just wants check, left temple & right temple - scaly).  General skin check Location: Multiple new crusts face and scalp Duration:  Quality:  Associated Signs/Symptoms: Modifying Factors:  Severity:  Timing: Context: History of skin cancers  Objective  Well appearing patient in no apparent distress; mood and affect are within normal limits. Objective  Chest - Medial [facility]): No sign recurrence  Objective  Chest - Medial [facility]): General skin examination, no atypical moles or melanoma.  Multiple possible nonmelanoma skin cancers.  Objective  Left chest: Pearly focally eroded 7 mm papule: BCC     Objective  left Scalp: Hemorrhagic ill-defined 1.4 cm eroded crust: SCCA     Objective  Right Temple: Centrally eroded 1.3 cm crust, SCCA     Objective  Right  Neck: 1 cm moderately thick crust, near old scar, rule out SCCA     Objective  Left Forehead: Endophytic waxy 1 cm plaque, BCC     Objective  Mid Forehead: Multiple mostly small hornlike pink crusts    All skin waist up examined.   Assessment & Plan    History of squamous cell carcinoma Chest - Medial [facility])  Recheck as needed change  Screening exam for skin cancer Chest - Medial [facility])  Annual skin examination, encouraged to self examine twice annually, continued ultraviolet protection.  Neoplasm of uncertain behavior of skin (5) Left chest  Skin / nail biopsy Type of biopsy: tangential   Informed consent: discussed and consent obtained   Timeout: patient name, date of birth, surgical site, and procedure verified   Anesthesia: the lesion was anesthetized in a standard fashion   Anesthetic:  1% lidocaine w/ epinephrine 1-100,000 local infiltration Instrument used: flexible razor blade   Hemostasis achieved with: aluminum chloride and  electrodesiccation   Outcome: patient tolerated procedure well   Post-procedure details: wound care instructions given    Specimen 1 - Surgical pathology Differential Diagnosis: bcc scc  Check Margins: No  left Scalp  Skin / nail biopsy Type of biopsy: tangential   Informed consent: discussed and consent obtained   Timeout: patient name, date of birth, surgical site, and procedure verified   Anesthesia: the lesion was anesthetized in a standard fashion   Anesthetic:  1% lidocaine w/ epinephrine 1-100,000 local infiltration Instrument used: flexible razor blade   Hemostasis achieved with: aluminum chloride and electrodesiccation   Outcome: patient tolerated procedure well   Post-procedure details: wound care instructions given    Specimen 2 - Surgical pathology Differential Diagnosis: bcc scc  Check Margins: No  Right Temple  Skin / nail biopsy Type of biopsy: tangential   Informed consent: discussed and consent obtained   Timeout: patient name, date of birth, surgical site, and procedure verified   Procedure prep:  Patient was prepped and draped in usual sterile fashion (Non sterile) Prep type:  Chlorhexidine Anesthesia: the lesion was anesthetized in a standard fashion   Anesthetic:  1% lidocaine w/ epinephrine 1-100,000 local infiltration Instrument used: flexible razor blade   Outcome: patient tolerated procedure well   Post-procedure details: wound care instructions given    Specimen 3 - Surgical pathology Differential Diagnosis: bcc scc  Check Margins: No  Right  Neck  Skin / nail biopsy Type of biopsy: tangential   Informed consent: discussed and consent obtained   Timeout: patient name, date of birth, surgical  site, and procedure verified   Procedure prep:  Patient was prepped and draped in usual sterile fashion (Non sterile) Prep type:  Chlorhexidine Anesthesia: the lesion was anesthetized in a standard fashion   Anesthetic:  1% lidocaine w/ epinephrine  1-100,000 local infiltration Instrument used: flexible razor blade   Outcome: patient tolerated procedure well   Post-procedure details: wound care instructions given    Specimen 4 - Surgical pathology Differential Diagnosis: bcc scc  Check Margins: No  Left Forehead  Skin / nail biopsy Type of biopsy: tangential   Informed consent: discussed and consent obtained   Timeout: patient name, date of birth, surgical site, and procedure verified   Anesthesia: the lesion was anesthetized in a standard fashion   Anesthetic:  1% lidocaine w/ epinephrine 1-100,000 local infiltration Instrument used: flexible razor blade   Hemostasis achieved with: ferric subsulfate   Outcome: patient tolerated procedure well   Post-procedure details: sterile dressing applied and wound care instructions given   Dressing type: bandage and petrolatum    Specimen 5 - Surgical pathology Differential Diagnosis: bcc scc   Check Margins: No  AK (actinic keratosis) Mid Forehead  Defer intervention until larger lesions have been treated.  Future candidate for [place] or PDT.      I, Lavonna Monarch, MD, have reviewed all documentation for this visit.  The documentation on 02/13/20 for the exam, diagnosis, procedures, and orders are all accurate and complete.

## 2020-03-04 IMAGING — DX DG KNEE AP/LAT W/ SUNRISE*R*
3 series · 3 of 3 positions shown · non-contrast
Comparison: None.

CLINICAL DATA: 57-year-old male with right knee pain 1 month ago.
No pain at the current time. Initial encounter.

EXAM:
RIGHT KNEE 3 VIEWS

[knee ap]
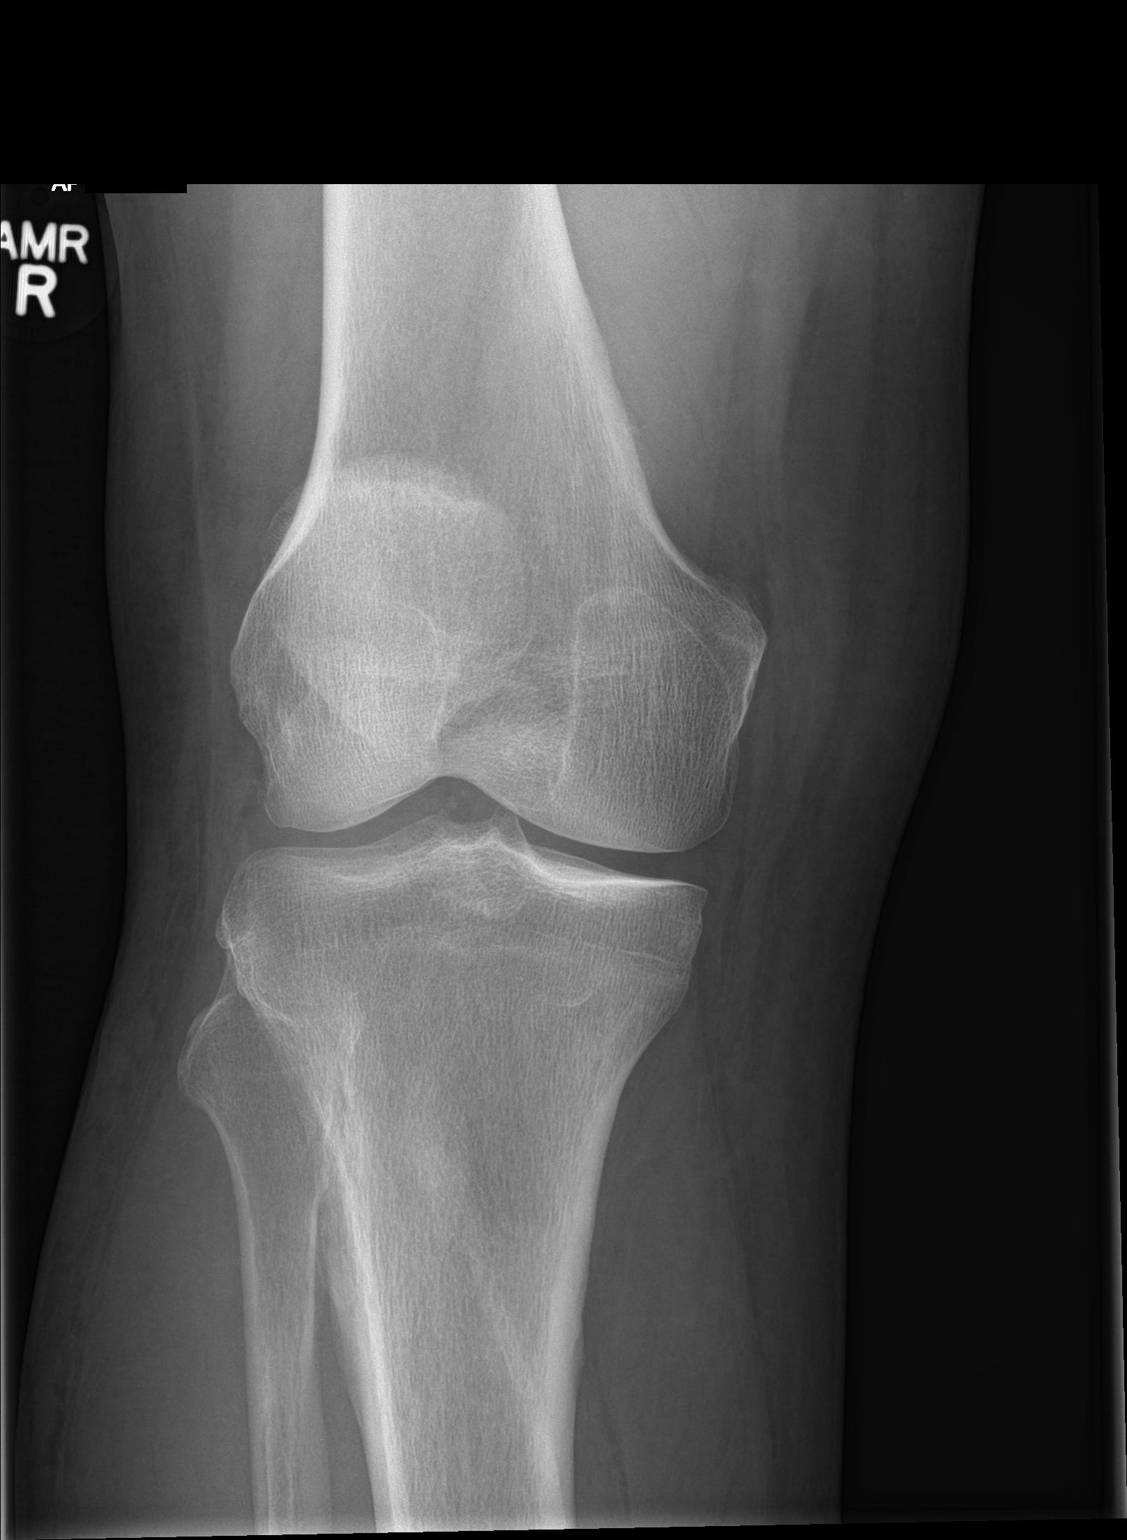

[knee lat]
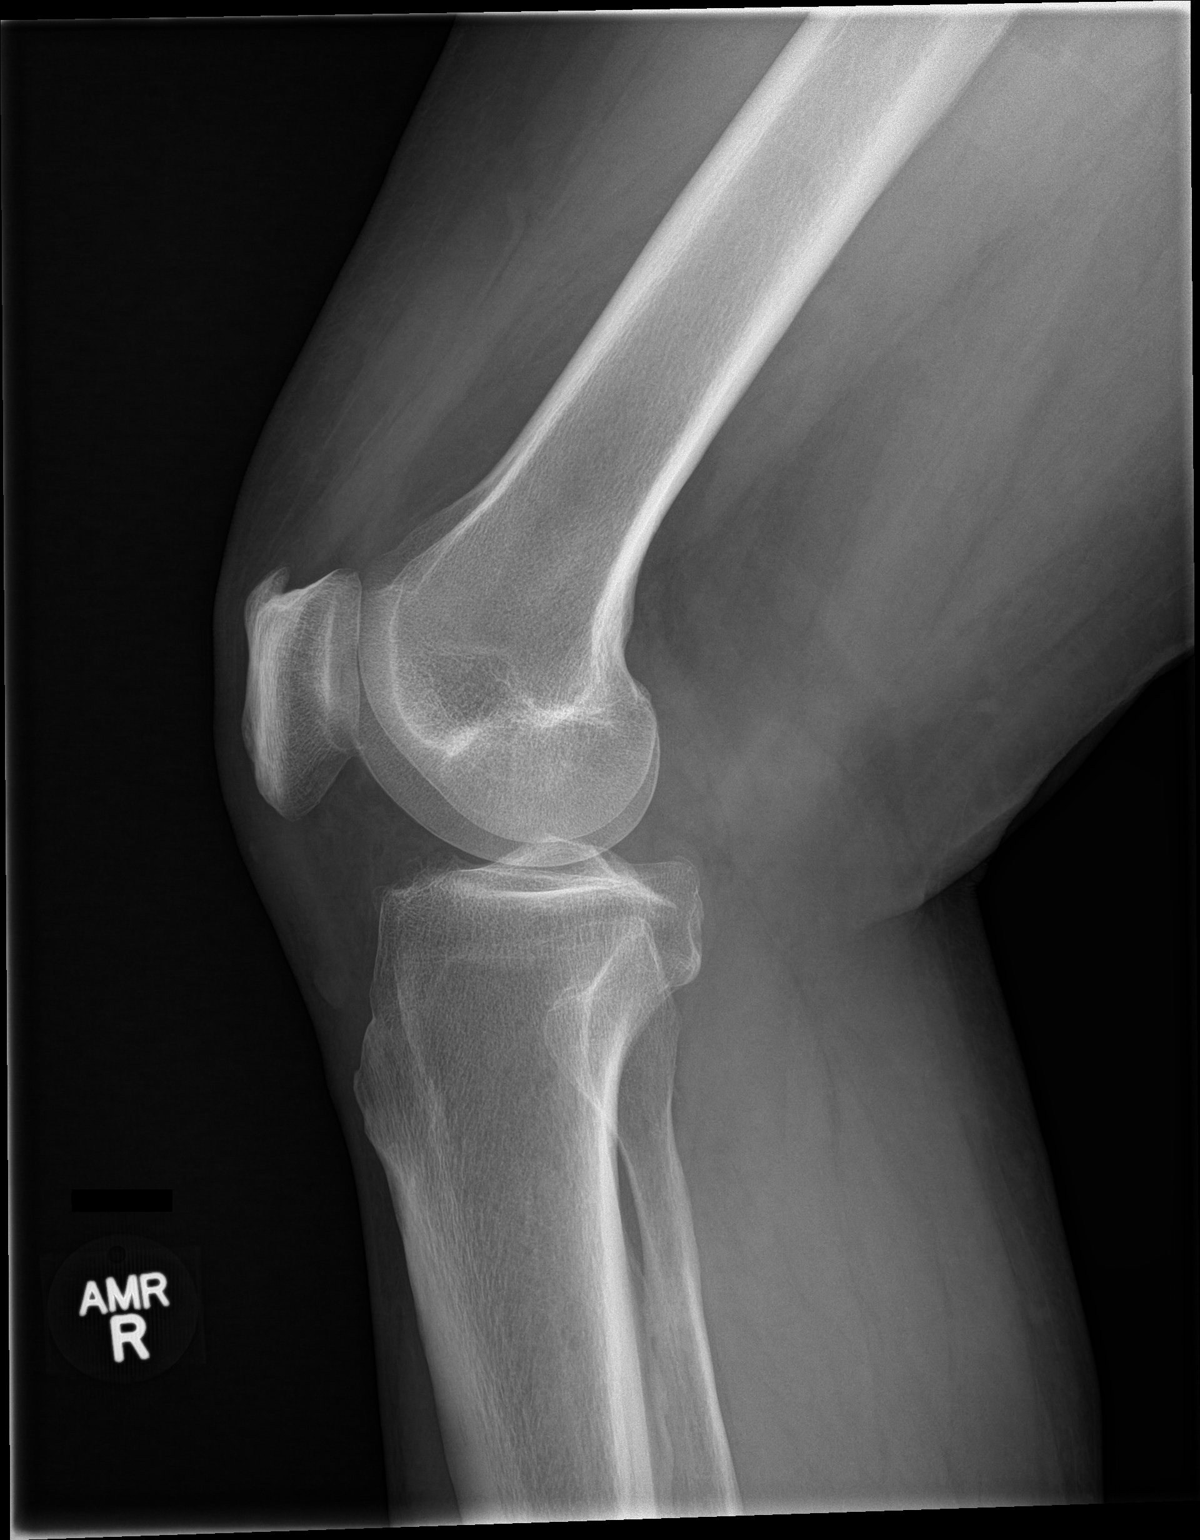

[patella skyline]
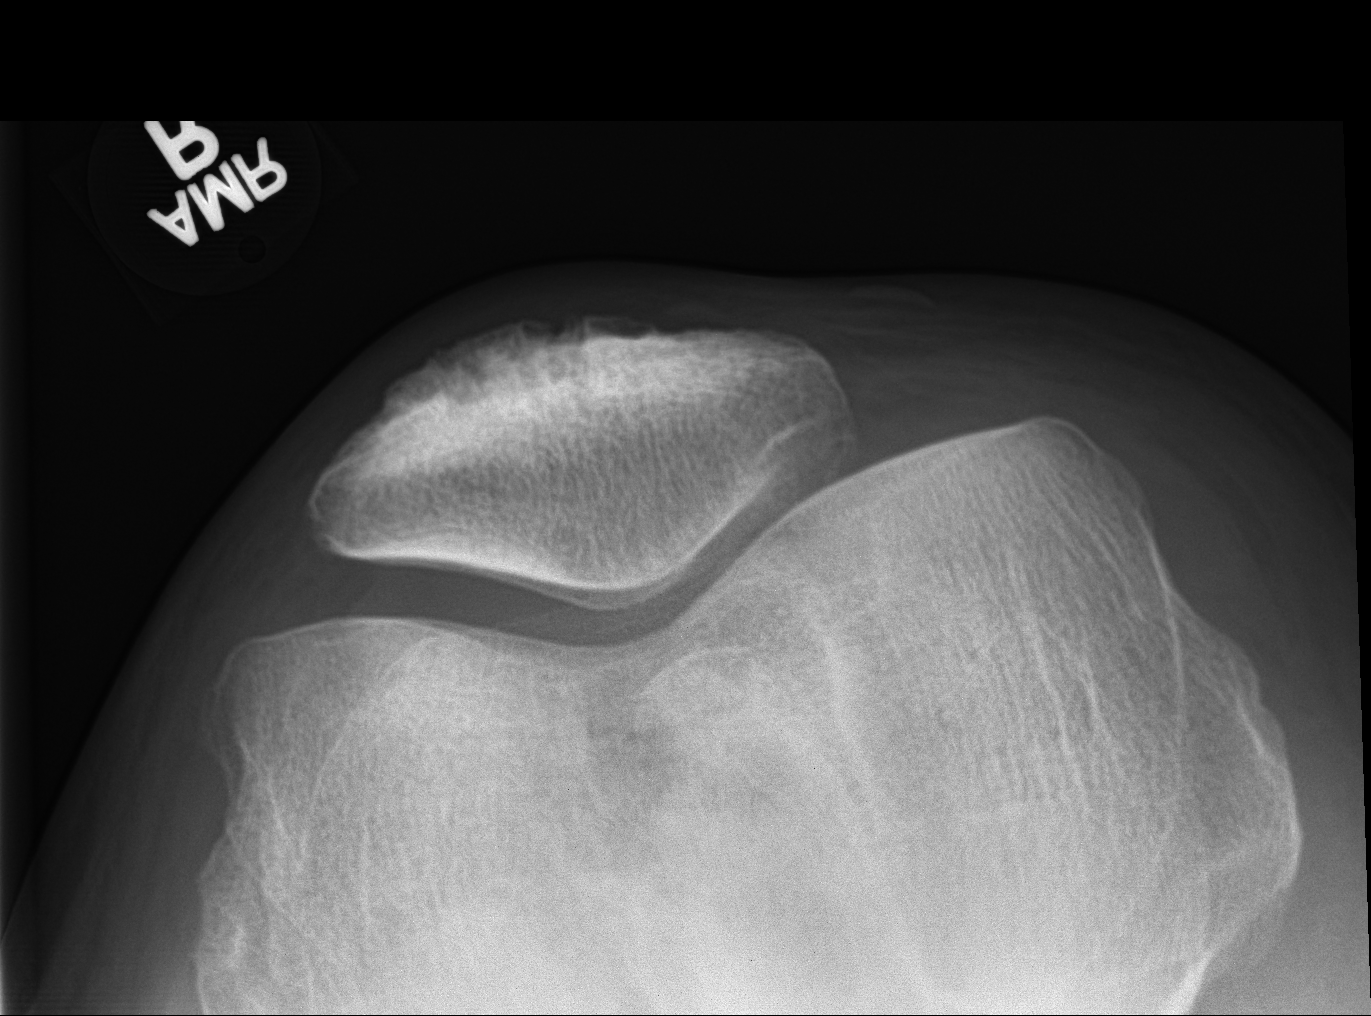

[3 of 3 positions shown; findings below may reference images not displayed]

FINDINGS: Minimal patellofemoral joint degenerative changes. Small spur
superior aspect of the patella.

No significant tibiofemoral joint space narrowing.

No significant joint effusion.

No fracture or dislocation.
IMPRESSION: Minimal patellofemoral joint degenerative changes.

## 2020-04-01 ENCOUNTER — Encounter: Payer: BC Managed Care – PPO | Admitting: Dermatology

## 2020-04-08 ENCOUNTER — Ambulatory Visit (INDEPENDENT_AMBULATORY_CARE_PROVIDER_SITE_OTHER): Payer: BC Managed Care – PPO | Admitting: Dermatology

## 2020-04-08 ENCOUNTER — Encounter: Payer: Self-pay | Admitting: Dermatology

## 2020-04-08 ENCOUNTER — Other Ambulatory Visit: Payer: Self-pay

## 2020-04-08 DIAGNOSIS — C4442 Squamous cell carcinoma of skin of scalp and neck: Secondary | ICD-10-CM | POA: Diagnosis not present

## 2020-04-08 DIAGNOSIS — D485 Neoplasm of uncertain behavior of skin: Secondary | ICD-10-CM

## 2020-04-08 DIAGNOSIS — D0422 Carcinoma in situ of skin of left ear and external auricular canal: Secondary | ICD-10-CM | POA: Diagnosis not present

## 2020-04-08 DIAGNOSIS — C4492 Squamous cell carcinoma of skin, unspecified: Secondary | ICD-10-CM

## 2020-04-08 HISTORY — DX: Squamous cell carcinoma of skin, unspecified: C44.92

## 2020-04-08 NOTE — Patient Instructions (Signed)

## 2020-04-19 ENCOUNTER — Encounter: Payer: Self-pay | Admitting: *Deleted

## 2020-04-19 ENCOUNTER — Telehealth: Payer: Self-pay | Admitting: *Deleted

## 2020-04-19 NOTE — Telephone Encounter (Signed)
-----   Message from Lavonna Monarch, MD sent at 04/19/2020  1:29 PM EDT ----- Schedule surgery with Dr. Darene Lamer

## 2020-04-19 NOTE — Telephone Encounter (Signed)
Pathology to patient- 30 minutes surgery scheduled.

## 2020-04-20 DIAGNOSIS — C44329 Squamous cell carcinoma of skin of other parts of face: Secondary | ICD-10-CM | POA: Diagnosis not present

## 2020-04-20 DIAGNOSIS — L905 Scar conditions and fibrosis of skin: Secondary | ICD-10-CM | POA: Diagnosis not present

## 2020-04-24 ENCOUNTER — Encounter: Payer: Self-pay | Admitting: Dermatology

## 2020-04-24 NOTE — Progress Notes (Signed)
   Follow-Up Visit   Subjective  Bryce Lewis is a 60 y.o. male who presents for the following: Procedure (Here for treamtent- left chest & left forehead- bcc x 2 , left scalp- cis x 1 , right temple- well diff scc- infiltration ).  Multiple biopsy-proven skin cancers plus growing crust on left ear. Location:  Duration:  Quality:  Associated Signs/Symptoms: Modifying Factors:  Severity:  Timing: Context:   Objective  Well appearing patient in no apparent distress; mood and affect are within normal limits. Objective  Left Superior Helix: Pink pearly 1 cm crust, rule out superficial carcinoma     Objective  Left Scalp: Biopsy site identified by nurse and me.  This is the lesion that most bothers Bryce Lewis.    A focused examination was performed including Head and neck.. Relevant physical exam findings are noted in the Assessment and Plan.   Assessment & Plan    Neoplasm of uncertain behavior of skin Left Superior Helix  Skin / nail biopsy Type of biopsy: tangential   Informed consent: discussed and consent obtained   Timeout: patient name, date of birth, surgical site, and procedure verified   Procedure prep:  Patient was prepped and draped in usual sterile fashion (Non sterile) Prep type:  Chlorhexidine Anesthesia: the lesion was anesthetized in a standard fashion   Anesthetic:  1% lidocaine w/ epinephrine 1-100,000 local infiltration Instrument used: flexible razor blade   Outcome: patient tolerated procedure well   Post-procedure details: wound care instructions given    Specimen 1 - Surgical pathology Differential Diagnosis: bcc vs scc  Check Margins: No  Squamous cell carcinoma of skin Left Scalp  Destruction of lesion Complexity: simple   Destruction method: electrodesiccation and curettage   Informed consent: discussed and consent obtained   Timeout:  patient name, date of birth, surgical site, and procedure verified Anesthesia: the lesion was  anesthetized in a standard fashion   Anesthetic:  1% lidocaine w/ epinephrine 1-100,000 local infiltration Curettage performed in three different directions: Yes   Curettage cycles:  3 Lesion length (cm):  1.4 Lesion width (cm):  1.4 Margin per side (cm):  0 Final wound size (cm):  1.4 Hemostasis achieved with:  ferric subsulfate Outcome: patient tolerated procedure with difficulty   Additional details:  Wound innoculated with 5 fluorouracil solution.      I, Lavonna Monarch, MD, have reviewed all documentation for this visit.  The documentation on 04/24/20 for the exam, diagnosis, procedures, and orders are all accurate and complete.

## 2020-05-04 DIAGNOSIS — S01401A Unspecified open wound of right cheek and temporomandibular area, initial encounter: Secondary | ICD-10-CM | POA: Diagnosis not present

## 2020-05-12 DIAGNOSIS — S0100XA Unspecified open wound of scalp, initial encounter: Secondary | ICD-10-CM | POA: Diagnosis not present

## 2020-05-12 DIAGNOSIS — Z4801 Encounter for change or removal of surgical wound dressing: Secondary | ICD-10-CM | POA: Diagnosis not present

## 2020-05-20 DIAGNOSIS — S0100XD Unspecified open wound of scalp, subsequent encounter: Secondary | ICD-10-CM | POA: Diagnosis not present

## 2020-06-03 ENCOUNTER — Ambulatory Visit (INDEPENDENT_AMBULATORY_CARE_PROVIDER_SITE_OTHER): Payer: Commercial Managed Care - PPO | Admitting: Dermatology

## 2020-06-03 ENCOUNTER — Encounter: Payer: BC Managed Care – PPO | Admitting: Physician Assistant

## 2020-06-03 ENCOUNTER — Encounter: Payer: Self-pay | Admitting: Dermatology

## 2020-06-03 ENCOUNTER — Other Ambulatory Visit: Payer: Self-pay

## 2020-06-03 DIAGNOSIS — D0422 Carcinoma in situ of skin of left ear and external auricular canal: Secondary | ICD-10-CM

## 2020-06-03 NOTE — Patient Instructions (Signed)

## 2020-06-17 ENCOUNTER — Encounter: Payer: Self-pay | Admitting: Dermatology

## 2020-07-29 ENCOUNTER — Other Ambulatory Visit: Payer: Self-pay

## 2020-07-29 ENCOUNTER — Ambulatory Visit (INDEPENDENT_AMBULATORY_CARE_PROVIDER_SITE_OTHER): Payer: Commercial Managed Care - PPO | Admitting: Dermatology

## 2020-07-29 DIAGNOSIS — D049 Carcinoma in situ of skin, unspecified: Secondary | ICD-10-CM

## 2020-07-29 DIAGNOSIS — D0422 Carcinoma in situ of skin of left ear and external auricular canal: Secondary | ICD-10-CM | POA: Diagnosis not present

## 2020-07-29 NOTE — Patient Instructions (Signed)

## 2020-08-05 ENCOUNTER — Ambulatory Visit (INDEPENDENT_AMBULATORY_CARE_PROVIDER_SITE_OTHER): Payer: Commercial Managed Care - PPO

## 2020-08-05 ENCOUNTER — Other Ambulatory Visit: Payer: Self-pay

## 2020-08-05 DIAGNOSIS — Z4802 Encounter for removal of sutures: Secondary | ICD-10-CM

## 2020-08-05 NOTE — Progress Notes (Signed)
NTS Suture removal, No s/s of infection.

## 2020-08-07 ENCOUNTER — Encounter: Payer: Self-pay | Admitting: Dermatology

## 2020-08-07 NOTE — Progress Notes (Signed)
   Follow-Up Visit   Subjective  Bryce Lewis is a 60 y.o. male who presents for the following: Procedure (Here to treat Left superior helix CIS. Has lesion in other ear he wants looked at. ).  Skin cancer left ear Location:  Duration:  Quality:  Associated Signs/Symptoms: Modifying Factors:  Severity:  Timing: Context:   Objective  Well appearing patient in no apparent distress; mood and affect are within normal limits. Left Superior Helix Lesion identified by Dr.Sanna Porcaro and nurse in room. 5-0 nylon x 3     A focused examination was performed including head and neck.. Relevant physical exam findings are noted in the Assessment and Plan.   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  Destruction of lesion Complexity: simple   Destruction method: electrodesiccation and curettage   Informed consent: discussed and consent obtained   Timeout:  patient name, date of birth, surgical site, and procedure verified Anesthesia: the lesion was anesthetized in a standard fashion   Anesthetic:  1% lidocaine w/ epinephrine 1-100,000 local infiltration Curettage performed in three different directions: Yes   Curettage cycles:  3 Hemostasis achieved with:  ferric subsulfate Outcome: patient tolerated procedure well with no complications   Additional details:  Wound innoculated with 5 fluorouracil solution.  Skin excision  Lesion length (cm):  1 Lesion width (cm):  1 Margin per side (cm):  0.1 Total excision diameter (cm):  1.2 Informed consent: discussed and consent obtained   Timeout: patient name, date of birth, surgical site, and procedure verified   Anesthesia: the lesion was anesthetized in a standard fashion   Anesthetic:  1% lidocaine w/ epinephrine 1-100,000 local infiltration Instrument used: #15 blade   Hemostasis achieved with: pressure and electrodesiccation   Outcome: patient tolerated procedure well with no  complications   Post-procedure details: sterile dressing applied and wound care instructions given   Dressing type: bandage, petrolatum and pressure dressing   Additional details:  No specimen sent per Dr.Marilla Boddy.  The removed tissue was in pieces so would be difficult to for histologic orientation.  We will follow with eye examinations.  Double curettage showed this to be a deeper than expected squamous cell carcinoma rather than in situ carcinoma; there were focal exposed cartilage so after cautery and ring curette narrow margin ellipse with undermining was performed to allow coverage of the cartilage.  Simple suture closure.      I, Lavonna Monarch, MD, have reviewed all documentation for this visit.  The documentation on 08/07/20 for the exam, diagnosis, procedures, and orders are all accurate and complete.

## 2020-08-17 ENCOUNTER — Encounter: Payer: Self-pay | Admitting: Dermatology

## 2020-08-17 NOTE — Progress Notes (Signed)
   Follow-Up Visit   Subjective  Bryce Lewis is a 60 y.o. male who presents for the following: Procedure (Scc left ear). Biopsy-proven CIS left ear Location:  Duration:  Quality:  Associated Signs/Symptoms: Modifying Factors:  Severity:  Timing: Context:   Objective  Well appearing patient in no apparent distress; mood and affect are within normal limits. Left Ear Biopsy-proven SCCA in situ of left ear    A focused examination was performed including head and neck.. Relevant physical exam findings are noted in the Assessment and Plan.   Assessment & Plan    Carcinoma in situ of skin of left ear Left Ear  Patient going out of town' so intervention will be deferred.      I, Lavonna Monarch, MD, have reviewed all documentation for this visit.  The documentation on 08/17/20 for the exam, diagnosis, procedures, and orders are all accurate and complete.

## 2020-10-11 ENCOUNTER — Ambulatory Visit: Payer: Commercial Managed Care - PPO | Admitting: Dermatology

## 2020-12-21 ENCOUNTER — Ambulatory Visit (HOSPITAL_COMMUNITY): Payer: Commercial Managed Care - PPO | Admitting: Hematology

## 2021-07-29 ENCOUNTER — Encounter (INDEPENDENT_AMBULATORY_CARE_PROVIDER_SITE_OTHER): Payer: Self-pay | Admitting: *Deleted

## 2021-08-23 ENCOUNTER — Ambulatory Visit
Admission: RE | Admit: 2021-08-23 | Discharge: 2021-08-23 | Disposition: A | Discharge status: Home / Self Care | Payer: Commercial Managed Care - PPO | Source: Ambulatory Visit

## 2021-08-23 VITALS — BP 135/90 | HR 72 | Temp 98.6°F | Resp 18

## 2021-08-23 DIAGNOSIS — H9191 Unspecified hearing loss, right ear: Secondary | ICD-10-CM

## 2021-08-23 DIAGNOSIS — H9211 Otorrhea, right ear: Secondary | ICD-10-CM | POA: Diagnosis not present

## 2021-08-23 MED ORDER — AMOXICILLIN-POT CLAVULANATE 875-125 MG PO TABS: 1.0000 | ORAL_TABLET | Freq: Two times a day (BID) | ORAL | 0 refills | Status: DC

## 2021-08-23 NOTE — Discharge Instructions (Addendum)
Take medication as prescribed. May take Tylenol or for pain, fever, or general discomfort. Warm compresses to the affected ear help with comfort. Do not stick anything inside the ear while symptoms persist. Avoid getting water inside of the ear while symptoms persist. Follow-up if symptoms do not improve.

## 2021-08-23 NOTE — ED Triage Notes (Signed)
Unable to hear well out of right ear x 3 weeks

## 2021-08-23 NOTE — ED Provider Notes (Signed)
[facility]    CSN: [id number] Arrival date & time: 08/23/21  1714      History   Chief Complaint Chief Complaint  Patient presents with   Ear Fullness    Right ear feels full. Hearing muffled - Entered by patient    HPI Bryce Lewis is a 61 y.o. male.   The history is provided by the patient.   Patient presents for complaints of right ear pain has been present for the past 3 weeks.  Patient complains of decreased hearing in the right ear.  Patient states "I feel like it stopped up".  Patient states that he used a camera at home to look inside the ear and he saw what looked like pus.  He states that he used over-the-counter eardrops and Borax in the ear.  He states that with the Borax, he experienced temporary relief, but his symptoms soon returned.  He denies fever, chills, nasal congestion, runny nose, headache, sore throat, abdominal pain, nausea, vomiting, or diarrhea.  Patient states that he does have a history of basal cell carcinoma and underwent radiation. Past Medical History:  Diagnosis Date   Cancer [facility])    Bone cancer as a child. Squamous to face   Nodular basal cell carcinoma (BCC) 02/04/2020   Left Forehead   SCCA (squamous cell carcinoma) of skin 02/04/2020   Left Scalp (in situ)(CX35FU)   SCCA (squamous cell carcinoma) of skin 02/04/2020   Right Temple (well diff)   SCCA (squamous cell carcinoma) of skin 02/04/2020   Right Neck (in situ)   Sleep apnea    CPAP 7   Sleep disturbance 06/23/2013   Squamous cell carcinoma of skin 04/08/2020   in situ- left superior helix (curet and excision)   Superficial nodular basal cell carcinoma (BCC) 02/04/2020   Left Chest    Patient Active Problem List   Diagnosis Date Noted   Sleep disturbance 06/23/2013   Family history of acute myocardial infarction 04/18/2011   Family history of prostate cancer 04/18/2011    Past Surgical History:  Procedure Laterality Date   COLONOSCOPY N/A 08/13/2014    Procedure: COLONOSCOPY;  Surgeon: Rogene Houston, MD;  [facility];  Service: Endoscopy;  Laterality: N/A;  220 - moved to 8:30 - Ann notified pt   squamous cell carcinoma to rt side of face removed at [facility] Medications    Prior to Admission medications   Medication Sig Start Date End Date Taking? Authorizing Provider  amoxicillin-clavulanate (AUGMENTIN) 875-125 MG tablet Take 1 tablet by mouth every 12 (twelve) hours. 08/23/21  Yes Elesia Pemberton-Warren, Alda Lea, NP  apixaban (ELIQUIS) 5 MG TABS tablet Take 5 mg by mouth 2 (two) times daily.   Yes [provider]  ibuprofen (ADVIL,MOTRIN) 200 MG tablet Take 200 mg by mouth every 6 (six) hours as needed for moderate pain.  Patient not taking: No sig reported    [provider]    Family History Family History  Problem Relation Age of Onset   Hypertension Mother    Parkinson's disease Mother    Heart disease Father    Cancer Brother     Social History Social History   Tobacco Use   Smoking status: Former   Smokeless tobacco: Never  Scientific laboratory technician Use: Never used  Substance Use Topics   Alcohol use: Yes    Comment: occ   Drug use: No  Allergies   Lansoprazole   Review of Systems Review of Systems Per HPI  Physical Exam Triage Vital Signs ED Triage Vitals  Enc Vitals Group     BP 08/23/21 1731 (!) 135/90     Pulse Rate 08/23/21 1731 72     Resp 08/23/21 1731 18     Temp 08/23/21 1731 98.6 F (37 C)     Temp Source 08/23/21 1731 Oral     SpO2 08/23/21 1731 94 %     Weight --      Height --      Head Circumference --      Peak Flow --      Pain Score 08/23/21 1732 0     Pain Loc --      Pain Edu? --      Excl. in [place]? --    No data found.  Updated Vital Signs BP (!) 135/90 (BP Location: Right Arm)   Pulse 72   Temp 98.6 F (37 C) (Oral)   Resp 18   SpO2 94%   Visual Acuity Right Eye Distance:   Left Eye Distance:   Bilateral Distance:    Right  Eye Near:   Left Eye Near:    Bilateral Near:     Physical Exam Vitals and nursing note reviewed.  Constitutional:      General: He is not in acute distress.    Appearance: Normal appearance.  HENT:     Head: Normocephalic.     Right Ear: Ear canal and external ear normal. Decreased hearing noted. Right ear drainage: Pus appearing drainage and bright green drainage noted in front of the right tympanic membrane.. Tympanic membrane is retracted. Tympanic membrane is not erythematous.     Left Ear: Hearing, tympanic membrane, ear canal and external ear normal.     Nose: Nose normal.     Mouth/Throat:     Mouth: Mucous membranes are moist.     Pharynx: No oropharyngeal exudate.  Eyes:     Extraocular Movements: Extraocular movements intact.     Conjunctiva/sclera: Conjunctivae normal.     Pupils: Pupils are equal, round, and reactive to light.  Cardiovascular:     Rate and Rhythm: Normal rate and regular rhythm.     Heart sounds: Normal heart sounds.  Pulmonary:     Effort: Pulmonary effort is normal.     Breath sounds: Normal breath sounds.  Abdominal:     General: Bowel sounds are normal.  Musculoskeletal:     Cervical back: Normal range of motion.  Skin:    General: Skin is warm and dry.  Neurological:     General: No focal deficit present.     Mental Status: He is alert and oriented to person, place, and time.  Psychiatric:        Mood and Affect: Mood normal.        Behavior: Behavior normal.      UC Treatments / Results  Labs (all labs ordered are listed, but only abnormal results are displayed) Labs Reviewed - No data to display  EKG   Radiology No results found.  Procedures Procedures (including critical care time)  Medications Ordered in UC Medications - No data to display  Initial Impression / Assessment and Plan / UC Course  I have reviewed the triage vital signs and the nursing notes.  Pertinent labs & imaging results that were available during  my care of the patient were reviewed by me and considered in my  medical decision making (see chart for details).  Patient presents for complaints of right ear fullness has been present for the past 3 weeks.  On exam, patient has a pus appearing drainage and abnormal green drainage noted directly in front of the tympanic membrane.  Patient also has decreased hearing.  Difficult to ascertain if this is a true otitis media, but will treat patient at this time with Augmentin due to the presence of the drainage to appear infectious.  Discussion with patient regarding the abnormal appearance of the drainage in the right ear.  Patient was advised that if after completing the antibiotic, his symptoms do not improve, to have him follow-up in this clinic for reevaluation or with his primary care physician.  Patient was advised that if symptoms do not improve, ENT would probably be best to evaluation his ongoing symptoms.  Patient verbalizes understanding.  All questions were answered. Final Clinical Impressions(s) / UC Diagnoses   Final diagnoses:  Ear drainage right  Decreased hearing of right ear     Discharge Instructions      Take medication as prescribed. May take Tylenol or for pain, fever, or general discomfort. Warm compresses to the affected ear help with comfort. Do not stick anything inside the ear while symptoms persist. Avoid getting water inside of the ear while symptoms persist. Follow-up if symptoms do not improve.      ED Prescriptions     Medication Sig Dispense Auth. Provider   amoxicillin-clavulanate (AUGMENTIN) 875-125 MG tablet Take 1 tablet by mouth every 12 (twelve) hours. 14 tablet Ciaira Natividad-Warren, Alda Lea, NP      PDMP not reviewed this encounter.   Tish Men, NP 08/23/21 1816

## 2021-11-04 ENCOUNTER — Ambulatory Visit
Admission: RE | Admit: 2021-11-04 | Discharge: 2021-11-04 | Disposition: A | Discharge status: Home / Self Care | Payer: Commercial Managed Care - PPO | Source: Ambulatory Visit | Attending: Nurse Practitioner | Admitting: Nurse Practitioner

## 2021-11-04 ENCOUNTER — Other Ambulatory Visit: Payer: Self-pay

## 2021-11-04 VITALS — BP 147/78 | HR 74 | Temp 99.3°F | Resp 20

## 2021-11-04 DIAGNOSIS — J019 Acute sinusitis, unspecified: Secondary | ICD-10-CM | POA: Diagnosis not present

## 2021-11-04 MED ORDER — FLUTICASONE PROPIONATE 50 MCG/ACT NA SUSP: 2.0000 | Freq: Every day | NASAL | 0 refills | Status: AC

## 2021-11-04 MED ORDER — DOXYCYCLINE HYCLATE 100 MG PO TABS: 100.0000 mg | ORAL_TABLET | Freq: Two times a day (BID) | ORAL | 0 refills | Status: DC

## 2021-11-04 NOTE — ED Triage Notes (Signed)
Pt reports hoarseness, cough with intermittent chest congestion/phlegm x2 weeks. Pt reports spouse seen for similar and was given steroid injection and prednisone prescription earlier today.

## 2021-11-04 NOTE — Discharge Instructions (Addendum)
Take medication as prescribed.  Increase fluids and get plenty of rest. May take over-the-counter ibuprofen or Tylenol as needed for pain, fever, or general discomfort. Recommend normal saline nasal spray to help with nasal congestion throughout the day. For your cough, it may be helpful to use a humidifier at bedtime during sleep. If symptoms fail to improve over the next 7 to 10 days, please follow-up with your primary care physician for further evaluation.

## 2021-11-04 NOTE — ED Provider Notes (Signed)
[facility]    CSN: [id number] Arrival date & time: 11/04/21  1708      History   Chief Complaint Chief Complaint  Patient presents with   Cough    Sinus . Congestion . No fever - Entered by patient    HPI DAMARIEN NYMAN is a 61 y.o. male.   The history is provided by the patient.   Patient presents with a 2-week history of hoarseness, cough, chest congestion and low-grade fever.  Patient states that cough is productive.  He states that he has also had intermittent wheezing.  Patient denies sore throat, shortness of breath, difficulty breathing, or GI symptoms.  Patient states he has been taking over-the-counter sinus medication for his symptoms with minimal relief.   Past Medical History:  Diagnosis Date   Cancer [facility])    Bone cancer as a child. Squamous to face   Nodular basal cell carcinoma (BCC) 02/04/2020   Left Forehead   SCCA (squamous cell carcinoma) of skin 02/04/2020   Left Scalp (in situ)(CX35FU)   SCCA (squamous cell carcinoma) of skin 02/04/2020   Right Temple (well diff)   SCCA (squamous cell carcinoma) of skin 02/04/2020   Right Neck (in situ)   Sleep apnea    CPAP 7   Sleep disturbance 06/23/2013   Squamous cell carcinoma of skin 04/08/2020   in situ- left superior helix (curet and excision)   Superficial nodular basal cell carcinoma (BCC) 02/04/2020   Left Chest    Patient Active Problem List   Diagnosis Date Noted   Sleep disturbance 06/23/2013   Family history of acute myocardial infarction 04/18/2011   Family history of prostate cancer 04/18/2011    Past Surgical History:  Procedure Laterality Date   COLONOSCOPY N/A 08/13/2014   Procedure: COLONOSCOPY;  Surgeon: Rogene Houston, MD;  [facility];  Service: Endoscopy;  Laterality: N/A;  220 - moved to 8:30 - Ann notified pt   squamous cell carcinoma to rt side of face removed at [facility] Medications    Prior to Admission medications   Medication Sig  Start Date End Date Taking? Authorizing Provider  amoxicillin-clavulanate (AUGMENTIN) 875-125 MG tablet Take 1 tablet by mouth every 12 (twelve) hours. 08/23/21   Fenris Cauble-Warren, Alda Lea, NP  apixaban (ELIQUIS) 5 MG TABS tablet Take 5 mg by mouth 2 (two) times daily.    [provider]  doxycycline (VIBRA-TABS) 100 MG tablet Take 1 tablet (100 mg total) by mouth 2 (two) times daily. 11/04/21  Yes Miraj Truss-Warren, Alda Lea, NP  fluticasone (FLONASE) 50 MCG/ACT nasal spray Place 2 sprays into both nostrils daily. 11/04/21  Yes Younique Casad-Warren, Alda Lea, NP  ibuprofen (ADVIL,MOTRIN) 200 MG tablet Take 200 mg by mouth every 6 (six) hours as needed for moderate pain.  Patient not taking: Reported on 04/08/2020    [provider]    Family History Family History  Problem Relation Age of Onset   Hypertension Mother    Parkinson's disease Mother    Heart disease Father    Cancer Brother     Social History Social History   Tobacco Use   Smoking status: Former   Smokeless tobacco: Never  Scientific laboratory technician Use: Never used  Substance Use Topics   Alcohol use: Yes    Comment: occ   Drug use: No     Allergies   Lansoprazole   Review of Systems Review of Systems  Per HPI  Physical Exam Triage Vital Signs ED Triage Vitals  Enc Vitals Group     BP 11/04/21 1756 (!) 147/78     Pulse Rate 11/04/21 1756 74     Resp 11/04/21 1756 20     Temp 11/04/21 1756 99.3 F (37.4 C)     Temp Source 11/04/21 1756 Oral     SpO2 11/04/21 1756 93 %     Weight --      Height --      Head Circumference --      Peak Flow --      Pain Score 11/04/21 1758 0     Pain Loc --      Pain Edu? --      Excl. in [place]? --    No data found.  Updated Vital Signs BP (!) 147/78 (BP Location: Right Arm)   Pulse 74   Temp 99.3 F (37.4 C) (Oral)   Resp 20   SpO2 93%   Visual Acuity Right Eye Distance:   Left Eye Distance:   Bilateral Distance:    Right Eye Near:   Left Eye Near:     Bilateral Near:     Physical Exam Constitutional:      Appearance: He is well-developed.  HENT:     Head: Normocephalic and atraumatic.     Right Ear: Tympanic membrane, ear canal and external ear normal.     Left Ear: Tympanic membrane, ear canal and external ear normal.     Nose: Congestion present.     Mouth/Throat:     Lips: Pink.     Mouth: Mucous membranes are moist.     Pharynx: Uvula midline. Posterior oropharyngeal erythema present. No pharyngeal swelling, oropharyngeal exudate or uvula swelling.     Comments: Cobblestoning present on posterior tongue. Eyes:     Conjunctiva/sclera: Conjunctivae normal.     Pupils: Pupils are equal, round, and reactive to light.  Neck:     Thyroid: No thyromegaly.     Trachea: No tracheal deviation.  Cardiovascular:     Rate and Rhythm: Normal rate and regular rhythm.     Pulses: Normal pulses.     Heart sounds: Normal heart sounds.  Pulmonary:     Effort: Pulmonary effort is normal.     Breath sounds: Normal breath sounds.  Abdominal:     General: Bowel sounds are normal. There is no distension.     Palpations: Abdomen is soft.     Tenderness: There is no abdominal tenderness.  Musculoskeletal:     Cervical back: Normal range of motion and neck supple.  Skin:    General: Skin is warm and dry.  Neurological:     General: No focal deficit present.     Mental Status: He is alert and oriented to person, place, and time.  Psychiatric:        Mood and Affect: Mood normal.        Behavior: Behavior normal.        Thought Content: Thought content normal.        Judgment: Judgment normal.      UC Treatments / Results  Labs (all labs ordered are listed, but only abnormal results are displayed) Labs Reviewed - No data to display  EKG   Radiology No results found.  Procedures Procedures (including critical care time)  Medications Ordered in UC Medications - No data to display  Initial Impression / Assessment and Plan /  UC Course  I have reviewed the triage vital signs and the nursing notes.  Pertinent labs & imaging results that were available during my care of the patient were reviewed by me and considered in my medical decision making (see chart for details).  Presents with a 2-week history of sinus symptoms.  Patient's vital signs show that he is mildly hypertensive, vital signs otherwise stable, he is in no acute distress.  Patient's symptoms have been present for the past 2 weeks.  He has been taking over-the-counter medication with minimal relief.  Given the duration of the patient's symptoms, will start patient on doxycycline for acute sinusitis.  Patient was also prescribed fluticasone to help with sinus inflammation and nasal congestion.  Supportive care recommendations were provided to the patient to include over-the-counter ibuprofen or Tylenol as needed for pain or discomfort.  Increasing fluids and getting plenty of rest, and use of normal saline nasal spray.  Patient was advised to follow-up with his primary care physician if symptoms do not improve with this treatment regimen.  Patient verbalizes understanding.  All questions were answered. Final Clinical Impressions(s) / UC Diagnoses   Final diagnoses:  Acute sinusitis, recurrence not specified, unspecified location     Discharge Instructions      Take medication as prescribed.  Increase fluids and get plenty of rest. May take over-the-counter ibuprofen or Tylenol as needed for pain, fever, or general discomfort. Recommend normal saline nasal spray to help with nasal congestion throughout the day. For your cough, it may be helpful to use a humidifier at bedtime during sleep. If symptoms fail to improve over the next 7 to 10 days, please follow-up with your primary care physician for further evaluation.     ED Prescriptions     Medication Sig Dispense Auth. Provider   doxycycline (VIBRA-TABS) 100 MG tablet Take 1 tablet (100 mg total) by  mouth 2 (two) times daily. 14 tablet Riah Kehoe-Warren, Alda Lea, NP   fluticasone (FLONASE) 50 MCG/ACT nasal spray Place 2 sprays into both nostrils daily. 16 g Jackquelyn Sundberg-Warren, Alda Lea, NP      PDMP not reviewed this encounter.   Tish Men, NP 11/04/21 1814

## 2022-03-24 ENCOUNTER — Ambulatory Visit
Admission: EM | Admit: 2022-03-24 | Discharge: 2022-03-24 | Disposition: A | Discharge status: Home / Self Care | Payer: Commercial Managed Care - PPO | Attending: Family Medicine | Admitting: Family Medicine

## 2022-03-24 ENCOUNTER — Encounter: Payer: Self-pay | Admitting: Emergency Medicine

## 2022-03-24 ENCOUNTER — Other Ambulatory Visit: Payer: Self-pay

## 2022-03-24 DIAGNOSIS — J0100 Acute maxillary sinusitis, unspecified: Secondary | ICD-10-CM

## 2022-03-24 MED ORDER — AMOXICILLIN-POT CLAVULANATE 875-125 MG PO TABS: 1.0000 | ORAL_TABLET | Freq: Two times a day (BID) | ORAL | 0 refills | Status: DC

## 2022-03-24 NOTE — ED Triage Notes (Signed)
Pt reports nasal congestion, headache, sore throat x2 weeks. Pt reports otc medication decreased symptoms initially and then they came back worse. Home covid test negative x2 weeks ago.   Is currently taking flonase and zyrtec.

## 2022-03-24 NOTE — ED Provider Notes (Signed)
[facility]    CSN: [id number] Arrival date & time: 03/24/22  1709      History   Chief Complaint Chief Complaint  Patient presents with   Nasal Congestion    HPI Bryce Lewis is a 62 y.o. male.   Patient presenting today with 2-week history of nasal congestion, headache, sore throat.  States he had taken over-the-counter cold and congestion medication and symptoms were improving but 4 days ago symptoms started to worsen significantly.  Now having sinus pain and pressure, fatigue.  Continuing to take over-the-counter medications and consistently taking Zyrtec and Flonase for seasonal allergies.    Past Medical History:  Diagnosis Date   Cancer [facility])    Bone cancer as a child. Squamous to face   Nodular basal cell carcinoma (BCC) 02/04/2020   Left Forehead   SCCA (squamous cell carcinoma) of skin 02/04/2020   Left Scalp (in situ)(CX35FU)   SCCA (squamous cell carcinoma) of skin 02/04/2020   Right Temple (well diff)   SCCA (squamous cell carcinoma) of skin 02/04/2020   Right Neck (in situ)   Sleep apnea    CPAP 7   Sleep disturbance 06/23/2013   Squamous cell carcinoma of skin 04/08/2020   in situ- left superior helix (curet and excision)   Superficial nodular basal cell carcinoma (BCC) 02/04/2020   Left Chest    Patient Active Problem List   Diagnosis Date Noted   Sleep disturbance 06/23/2013   Family history of acute myocardial infarction 04/18/2011   Family history of prostate cancer 04/18/2011    Past Surgical History:  Procedure Laterality Date   COLONOSCOPY N/A 08/13/2014   Procedure: COLONOSCOPY;  Surgeon: Rogene Houston, MD;  [facility];  Service: Endoscopy;  Laterality: N/A;  220 - moved to 8:30 - Ann notified pt   squamous cell carcinoma to rt side of face removed at [facility] Medications    Prior to Admission medications   Medication Sig Start Date End Date Taking? Authorizing Provider   amoxicillin-clavulanate (AUGMENTIN) 875-125 MG tablet Take 1 tablet by mouth every 12 (twelve) hours. 03/24/22   Volney American, PA-C  apixaban (ELIQUIS) 5 MG TABS tablet Take 5 mg by mouth 2 (two) times daily.    [provider]  doxycycline (VIBRA-TABS) 100 MG tablet Take 1 tablet (100 mg total) by mouth 2 (two) times daily. 11/04/21   Leath-Warren, Alda Lea, NP  fluticasone (FLONASE) 50 MCG/ACT nasal spray Place 2 sprays into both nostrils daily. Patient taking differently: Place 2 sprays into both nostrils as needed. 11/04/21   Leath-Warren, Alda Lea, NP  ibuprofen (ADVIL,MOTRIN) 200 MG tablet Take 200 mg by mouth every 6 (six) hours as needed for moderate pain.    [provider]    Family History Family History  Problem Relation Age of Onset   Hypertension Mother    Parkinson's disease Mother    Heart disease Father    Cancer Brother     Social History Social History   Tobacco Use   Smoking status: Former   Smokeless tobacco: Never  Scientific laboratory technician Use: Never used  Substance Use Topics   Alcohol use: Yes    Comment: occ   Drug use: No     Allergies   Lansoprazole   Review of Systems Review of Systems PER HPI  Physical Exam Triage Vital Signs ED Triage Vitals  Enc Vitals Group  BP 03/24/22 1720 134/83     Pulse Rate 03/24/22 1720 87     Resp 03/24/22 1720 20     Temp 03/24/22 1720 99.3 F (37.4 C)     Temp Source 03/24/22 1720 Oral     SpO2 03/24/22 1720 93 %     Weight --      Height --      Head Circumference --      Peak Flow --      Pain Score 03/24/22 1719 0     Pain Loc --      Pain Edu? --      Excl. in [place]? --    No data found.  Updated Vital Signs BP 134/83 (BP Location: Right Arm)   Pulse 87   Temp 99.3 F (37.4 C) (Oral)   Resp 20   SpO2 93%   Visual Acuity Right Eye Distance:   Left Eye Distance:   Bilateral Distance:    Right Eye Near:   Left Eye Near:    Bilateral Near:     Physical  Exam Vitals and nursing note reviewed.  Constitutional:      Appearance: He is well-developed.  HENT:     Head: Atraumatic.     Right Ear: External ear normal.     Left Ear: External ear normal.     Nose: Congestion present.     Mouth/Throat:     Pharynx: Posterior oropharyngeal erythema present. No oropharyngeal exudate.  Eyes:     Conjunctiva/sclera: Conjunctivae normal.     Pupils: Pupils are equal, round, and reactive to light.  Cardiovascular:     Rate and Rhythm: Normal rate and regular rhythm.  Pulmonary:     Effort: Pulmonary effort is normal. No respiratory distress.     Breath sounds: No wheezing or rales.  Musculoskeletal:        General: Normal range of motion.     Cervical back: Normal range of motion and neck supple.  Lymphadenopathy:     Cervical: No cervical adenopathy.  Skin:    General: Skin is warm and dry.  Neurological:     Mental Status: He is alert and oriented to person, place, and time.  Psychiatric:        Behavior: Behavior normal.      UC Treatments / Results  Labs (all labs ordered are listed, but only abnormal results are displayed) Labs Reviewed - No data to display  EKG   Radiology No results found.  Procedures Procedures (including critical care time)  Medications Ordered in UC Medications - No data to display  Initial Impression / Assessment and Plan / UC Course  I have reviewed the triage vital signs and the nursing notes.  Pertinent labs & imaging results that were available during my care of the patient were reviewed by me and considered in my medical decision making (see chart for details).     Given duration worsening course, treat with Augmentin, continue allergy regimen, sinus rinses and over-the-counter remedies.  Return for worsening symptoms.  Final Clinical Impressions(s) / UC Diagnoses   Final diagnoses:  Acute non-recurrent maxillary sinusitis   Discharge Instructions   None    ED Prescriptions      Medication Sig Dispense Auth. Provider   amoxicillin-clavulanate (AUGMENTIN) 875-125 MG tablet Take 1 tablet by mouth every 12 (twelve) hours. 14 tablet Volney American, [place]      PDMP not reviewed this encounter.   Volney American, [place] 03/24/22  1747  

## 2022-03-25 ENCOUNTER — Ambulatory Visit: Payer: Self-pay

## 2022-05-31 ENCOUNTER — Encounter: Payer: Self-pay | Admitting: *Deleted

## 2022-11-09 ENCOUNTER — Encounter: Payer: Self-pay | Admitting: *Deleted

## 2023-01-31 ENCOUNTER — Ambulatory Visit
Admission: EM | Admit: 2023-01-31 | Discharge: 2023-01-31 | Disposition: A | Discharge status: Home / Self Care | Payer: Commercial Managed Care - PPO | Attending: Nurse Practitioner | Admitting: Nurse Practitioner

## 2023-01-31 DIAGNOSIS — H60391 Other infective otitis externa, right ear: Secondary | ICD-10-CM | POA: Diagnosis not present

## 2023-01-31 DIAGNOSIS — H6011 Cellulitis of right external ear: Secondary | ICD-10-CM

## 2023-01-31 MED ORDER — CEPHALEXIN 500 MG PO CAPS: 500.0000 mg | ORAL_CAPSULE | Freq: Four times a day (QID) | ORAL | 0 refills | Status: AC

## 2023-01-31 MED ORDER — OFLOXACIN 0.3 % OT SOLN: 10.0000 [drp] | Freq: Every day | OTIC | 0 refills | Status: AC

## 2023-01-31 NOTE — ED Provider Notes (Signed)
[facility]    CSN: [id number] Arrival date & time: 01/31/23  1716      History   Chief Complaint No chief complaint on file.   HPI Bryce Lewis is a 63 y.o. male.   Patient presents today for 1 day history of right ear redness, swelling, and worsening hearing.  He reports history of skin cancer on that side of his face and has muffled hearing at baseline, however this is worse.  He also reports history of radiation to the right side of his face and as a result has decreased sensation of the right ear.  Reports he has had congestion for a couple of days and woke up with significant swelling and redness to the right ear 1 day ago.  Denies any recent trauma or injury to the ear.  Denies fever or ear drainage.  No cough, sore throat or chest pain/shortness of breath.  No nausea or vomiting.  Has been wearing a cotton ball in the right ear.    Past Medical History:  Diagnosis Date   Cancer [facility])    Bone cancer as a child. Squamous to face   Nodular basal cell carcinoma (BCC) 02/04/2020   Left Forehead   SCCA (squamous cell carcinoma) of skin 02/04/2020   Left Scalp (in situ)(CX35FU)   SCCA (squamous cell carcinoma) of skin 02/04/2020   Right Temple (well diff)   SCCA (squamous cell carcinoma) of skin 02/04/2020   Right Neck (in situ)   Sleep apnea    CPAP 7   Sleep disturbance 06/23/2013   Squamous cell carcinoma of skin 04/08/2020   in situ- left superior helix (curet and excision)   Superficial nodular basal cell carcinoma (BCC) 02/04/2020   Left Chest    Patient Active Problem List   Diagnosis Date Noted   Sleep disturbance 06/23/2013   Family history of acute myocardial infarction 04/18/2011   Family history of prostate cancer 04/18/2011    Past Surgical History:  Procedure Laterality Date   COLONOSCOPY N/A 08/13/2014   Procedure: COLONOSCOPY;  Surgeon: Malissa Hippo, MD;  [facility];  Service: Endoscopy;  Laterality: N/A;  220 - moved  to 8:30 - Ann notified pt   squamous cell carcinoma to rt side of face removed at [facility] Medications    Prior to Admission medications   Medication Sig Start Date End Date Taking? Authorizing Provider  cephALEXin (KEFLEX) 500 MG capsule Take 1 capsule (500 mg total) by mouth 4 (four) times daily for 5 days. 01/31/23 02/05/23 Yes Valentino Nose, NP  ofloxacin (FLOXIN) 0.3 % OTIC solution Place 10 drops into the right ear daily for 7 days. 01/31/23 02/07/23 Yes Valentino Nose, NP  apixaban (ELIQUIS) 5 MG TABS tablet Take 5 mg by mouth 2 (two) times daily.    [provider]  doxycycline (VIBRA-TABS) 100 MG tablet Take 1 tablet (100 mg total) by mouth 2 (two) times daily. 11/04/21   Leath-Warren, Sadie Haber, NP  fluticasone (FLONASE) 50 MCG/ACT nasal spray Place 2 sprays into both nostrils daily. Patient taking differently: Place 2 sprays into both nostrils as needed. 11/04/21   Leath-Warren, Sadie Haber, NP  ibuprofen (ADVIL,MOTRIN) 200 MG tablet Take 200 mg by mouth every 6 (six) hours as needed for moderate pain.    [provider]    Family History Family History  Problem Relation Age of Onset   Hypertension Mother  Parkinson's disease Mother    Heart disease Father    Cancer Brother     Social History Social History   Tobacco Use   Smoking status: Former   Smokeless tobacco: Never  Advertising account planner   Vaping status: Never Used  Substance Use Topics   Alcohol use: Yes    Comment: occ   Drug use: No     Allergies   Lansoprazole   Review of Systems Review of Systems Per HPI  Physical Exam Triage Vital Signs ED Triage Vitals  Encounter Vitals Group     BP 01/31/23 1827 (!) 151/86     Systolic BP Percentile --      Diastolic BP Percentile --      Pulse Rate 01/31/23 1827 71     Resp 01/31/23 1827 16     Temp 01/31/23 1827 98.6 F (37 C)     Temp Source 01/31/23 1827 Oral     SpO2 01/31/23 1827 93 %     Weight --      Height  --      Head Circumference --      Peak Flow --      Pain Score 01/31/23 1831 3     Pain Loc --      Pain Education --      Exclude from Growth Chart --    No data found.  Updated Vital Signs BP (!) 151/86 (BP Location: Right Arm)   Pulse 71   Temp 98.6 F (37 C) (Oral)   Resp 16   SpO2 93%   Visual Acuity Right Eye Distance:   Left Eye Distance:   Bilateral Distance:    Right Eye Near:   Left Eye Near:    Bilateral Near:     Physical Exam Vitals and nursing note reviewed.  Constitutional:      General: He is not in acute distress.    Appearance: Normal appearance. He is not toxic-appearing.  HENT:     Head: Normocephalic and atraumatic.     Comments: Blanchable erythema noted to right external auditory canal, erythema inferior to ear and anterior to ear.  No active drainage or oozing.  There is dried drainage noted to the external auditory canal.    Right Ear: Decreased hearing noted. Swelling present. No drainage or tenderness. No mastoid tenderness. Tympanic membrane is not injected, perforated, erythematous or bulging.     Left Ear: Tympanic membrane, ear canal and external ear normal. There is no impacted cerumen.     Nose: Nose normal. No congestion or rhinorrhea.     Mouth/Throat:     Mouth: Mucous membranes are moist.     Pharynx: Oropharynx is clear.  Pulmonary:     Effort: Pulmonary effort is normal. No respiratory distress.  Musculoskeletal:     Cervical back: Normal range of motion.  Lymphadenopathy:     Cervical: No cervical adenopathy.  Skin:    General: Skin is warm and dry.     Coloration: Skin is not jaundiced or pale.     Findings: Erythema present. No rash.  Neurological:     Mental Status: He is alert and oriented to person, place, and time.  Psychiatric:        Behavior: Behavior is cooperative.      UC Treatments / Results  Labs (all labs ordered are listed, but only abnormal results are displayed) Labs Reviewed - No data to  display  EKG   Radiology No results found.  Procedures Procedures (including critical care time)  Medications Ordered in UC Medications - No data to display  Initial Impression / Assessment and Plan / UC Course  I have reviewed the triage vital signs and the nursing notes.  Pertinent labs & imaging results that were available during my care of the patient were reviewed by me and considered in my medical decision making (see chart for details).   Patient is mildly hypertensive in triage, otherwise vital signs are stable.  1. Infective otitis externa of right ear 2. Cellulitis of right ear Treat with otic ofloxacin daily for 7 days, oral Keflex to cover for cellulitis for 5 days Other supportive measures discussed Return and ER precautions also discussed  The patient was given the opportunity to ask questions.  All questions answered to their satisfaction.  The patient is in agreement to this plan.    Final Clinical Impressions(s) / UC Diagnoses   Final diagnoses:  Infective otitis externa of right ear  Cellulitis of right ear     Discharge Instructions      Take Keflex 4 times daily as prescribed to treat for cellulitis of your right ear.  In addition, recommend using the ofloxacin drops 10 drops daily to the right ear for 7 days to help clear up any infection of your outer ear.  Recommend using Vaseline tipped cottonball in your ear after using the eardrops and whenever there is a threat of water getting in your ear.  Seek care if symptoms do not improve or worsen despite treatment.    ED Prescriptions     Medication Sig Dispense Auth. Provider   ofloxacin (FLOXIN) 0.3 % OTIC solution Place 10 drops into the right ear daily for 7 days. 5 mL Cathlean Marseilles A, NP   cephALEXin (KEFLEX) 500 MG capsule Take 1 capsule (500 mg total) by mouth 4 (four) times daily for 5 days. 20 capsule Valentino Nose, NP      PDMP not reviewed this encounter.   Valentino Nose, NP 01/31/23 [phone]

## 2023-01-31 NOTE — Discharge Instructions (Signed)
Take Keflex 4 times daily as prescribed to treat for cellulitis of your right ear.  In addition, recommend using the ofloxacin drops 10 drops daily to the right ear for 7 days to help clear up any infection of your outer ear.  Recommend using Vaseline tipped cottonball in your ear after using the eardrops and whenever there is a threat of water getting in your ear.  Seek care if symptoms do not improve or worsen despite treatment.

## 2023-01-31 NOTE — ED Triage Notes (Signed)
Pt reports swelling, redness, and pain to the right ear x 1 day. Pt states he had skin cancer, on the right ear. Denies injury to the ear.

## 2023-03-03 ENCOUNTER — Ambulatory Visit (INDEPENDENT_AMBULATORY_CARE_PROVIDER_SITE_OTHER): Payer: Commercial Managed Care - PPO

## 2023-03-03 ENCOUNTER — Ambulatory Visit
Admission: EM | Admit: 2023-03-03 | Discharge: 2023-03-03 | Disposition: A | Discharge status: Home / Self Care | Payer: Commercial Managed Care - PPO | Attending: Family Medicine | Admitting: Family Medicine

## 2023-03-03 DIAGNOSIS — J22 Unspecified acute lower respiratory infection: Secondary | ICD-10-CM

## 2023-03-03 DIAGNOSIS — R062 Wheezing: Secondary | ICD-10-CM

## 2023-03-03 DIAGNOSIS — R051 Acute cough: Secondary | ICD-10-CM | POA: Diagnosis not present

## 2023-03-03 MED ORDER — AZITHROMYCIN 250 MG PO TABS: ORAL_TABLET | ORAL | 0 refills | Status: DC

## 2023-03-03 MED ORDER — GUAIFENESIN ER 600 MG PO TB12: 600.0000 mg | ORAL_TABLET | Freq: Two times a day (BID) | ORAL | 0 refills | Status: DC | PRN

## 2023-03-03 MED ORDER — DEXAMETHASONE SODIUM PHOSPHATE 10 MG/ML IJ SOLN
10.0000 mg | Freq: Once | INTRAMUSCULAR | Status: AC
  Administered 2023-03-03: 10 mg via INTRAMUSCULAR

## 2023-03-03 MED ORDER — PROMETHAZINE-DM 6.25-15 MG/5ML PO SYRP: 5.0000 mL | ORAL_SOLUTION | Freq: Four times a day (QID) | ORAL | 0 refills | Status: DC | PRN

## 2023-03-03 NOTE — ED Provider Notes (Signed)
 [facility]    CSN: [id number] Arrival date & time: 03/03/23  0935      History   Chief Complaint No chief complaint on file.   HPI Bryce Lewis is a 63 y.o. male.   Presenting today with almost a week of progressively worsening productive cough, burning sensation in chest, chest rattling, fever, weakness, congestion.  Denies vomiting, diarrhea, rashes, loss of appetite.  So far trying over-the-counter cold and congestion medication with minimal relief.  No known history of chronic pulmonary disease.    Past Medical History:  Diagnosis Date   Cancer [facility])    Bone cancer as a child. Squamous to face   Nodular basal cell carcinoma (BCC) 02/04/2020   Left Forehead   SCCA (squamous cell carcinoma) of skin 02/04/2020   Left Scalp (in situ)(CX35FU)   SCCA (squamous cell carcinoma) of skin 02/04/2020   Right Temple (well diff)   SCCA (squamous cell carcinoma) of skin 02/04/2020   Right Neck (in situ)   Sleep apnea    CPAP 7   Sleep disturbance 06/23/2013   Squamous cell carcinoma of skin 04/08/2020   in situ- left superior helix (curet and excision)   Superficial nodular basal cell carcinoma (BCC) 02/04/2020   Left Chest    Patient Active Problem List   Diagnosis Date Noted   Sleep disturbance 06/23/2013   Family history of acute myocardial infarction 04/18/2011   Family history of prostate cancer 04/18/2011    Past Surgical History:  Procedure Laterality Date   COLONOSCOPY N/A 08/13/2014   Procedure: COLONOSCOPY;  Surgeon: Malissa Hippo, MD;  [facility];  Service: Endoscopy;  Laterality: N/A;  220 - moved to 8:30 - Ann notified pt   squamous cell carcinoma to rt side of face removed at [facility] Medications    Prior to Admission medications   Medication Sig Start Date End Date Taking? Authorizing Provider  azithromycin (ZITHROMAX) 250 MG tablet Take first 2 tablets together, then 1 every day until finished. 03/03/23  Yes  Particia Nearing, PA-C  guaiFENesin (MUCINEX) 600 MG 12 hr tablet Take 1 tablet (600 mg total) by mouth 2 (two) times daily as needed. 03/03/23  Yes Particia Nearing, PA-C  promethazine-dextromethorphan (PROMETHAZINE-DM) 6.25-15 MG/5ML syrup Take 5 mLs by mouth 4 (four) times daily as needed. 03/03/23  Yes Particia Nearing, PA-C  apixaban (ELIQUIS) 5 MG TABS tablet Take 5 mg by mouth 2 (two) times daily.    [provider]  doxycycline (VIBRA-TABS) 100 MG tablet Take 1 tablet (100 mg total) by mouth 2 (two) times daily. 11/04/21   Leath-Warren, Sadie Haber, NP  fluticasone (FLONASE) 50 MCG/ACT nasal spray Place 2 sprays into both nostrils daily. Patient taking differently: Place 2 sprays into both nostrils as needed. 11/04/21   Leath-Warren, Sadie Haber, NP  ibuprofen (ADVIL,MOTRIN) 200 MG tablet Take 200 mg by mouth every 6 (six) hours as needed for moderate pain.    [provider]    Family History Family History  Problem Relation Age of Onset   Hypertension Mother    Parkinson's disease Mother    Heart disease Father    Cancer Brother     Social History Social History   Tobacco Use   Smoking status: Former   Smokeless tobacco: Never  Advertising account planner   Vaping status: Never Used  Substance Use Topics   Alcohol use: Yes    Comment: occ  Drug use: No     Allergies   Lansoprazole   Review of Systems Review of Systems Per HPI  Physical Exam Triage Vital Signs ED Triage Vitals  Encounter Vitals Group     BP 03/03/23 0942 138/84     Systolic BP Percentile --      Diastolic BP Percentile --      Pulse Rate 03/03/23 0942 79     Resp 03/03/23 0942 18     Temp 03/03/23 0942 98.7 F (37.1 C)     Temp Source 03/03/23 0942 Oral     SpO2 03/03/23 0942 91 %     Weight --      Height --      Head Circumference --      Peak Flow --      Pain Score 03/03/23 0944 0     Pain Loc --      Pain Education --      Exclude from Growth Chart --    No  data found.  Updated Vital Signs BP 138/84 (BP Location: Right Arm)   Pulse 79   Temp 98.7 F (37.1 C) (Oral)   Resp 18   SpO2 91%   Visual Acuity Right Eye Distance:   Left Eye Distance:   Bilateral Distance:    Right Eye Near:   Left Eye Near:    Bilateral Near:     Physical Exam Vitals and nursing note reviewed.  Constitutional:      Appearance: He is well-developed.  HENT:     Head: Atraumatic.     Right Ear: External ear normal.     Left Ear: External ear normal.     Nose: Congestion present.     Mouth/Throat:     Pharynx: Posterior oropharyngeal erythema present. No oropharyngeal exudate.  Eyes:     Conjunctiva/sclera: Conjunctivae normal.     Pupils: Pupils are equal, round, and reactive to light.  Cardiovascular:     Rate and Rhythm: Normal rate and regular rhythm.  Pulmonary:     Effort: Pulmonary effort is normal. No respiratory distress.     Breath sounds: Wheezing and rales present.  Musculoskeletal:        General: Normal range of motion.     Cervical back: Normal range of motion and neck supple.  Lymphadenopathy:     Cervical: No cervical adenopathy.  Skin:    General: Skin is warm and dry.  Neurological:     Mental Status: He is alert and oriented to person, place, and time.  Psychiatric:        Behavior: Behavior normal.    UC Treatments / Results  Labs (all labs ordered are listed, but only abnormal results are displayed) Labs Reviewed - No data to display  EKG   Radiology DG Chest 2 View Result Date: 03/03/2023 CLINICAL DATA:  Productive cough. EXAM: CHEST - 2 VIEW COMPARISON:  January 20, 2019. FINDINGS: The heart size and mediastinal contours are within normal limits. Both lungs are clear. The visualized skeletal structures are unremarkable. IMPRESSION: No active cardiopulmonary disease. Electronically Signed   By: Lupita Raider M.D.   On: 03/03/2023 10:39    Procedures Procedures (including critical care time)  Medications  Ordered in UC Medications  dexamethasone (DECADRON) injection 10 mg (10 mg Intramuscular Given 03/03/23 1112)    Initial Impression / Assessment and Plan / UC Course  I have reviewed the triage vital signs and the nursing notes.  Pertinent labs &  imaging results that were available during my care of the patient were reviewed by me and considered in my medical decision making (see chart for details).     Oxygen saturation 91% on room air.  He appears in no acute distress.  Suspect viral infection causing a lower respiratory infection.  Chest x-ray was negative for pneumonia but based on exam findings will cover for developing bacterial infection with Zithromax, Mucinex, Phenergan DM and IM Decadron for chest tightness and wheezing.  Strict return precautions given for worsening symptoms.  Final Clinical Impressions(s) / UC Diagnoses   Final diagnoses:  Acute cough  Lower respiratory infection  Wheezing   Discharge Instructions   None    ED Prescriptions     Medication Sig Dispense Auth. Provider   azithromycin (ZITHROMAX) 250 MG tablet Take first 2 tablets together, then 1 every day until finished. 6 tablet Particia Nearing, [place]   promethazine-dextromethorphan (PROMETHAZINE-DM) 6.25-15 MG/5ML syrup Take 5 mLs by mouth 4 (four) times daily as needed. 100 mL Particia Nearing, PA-C   guaiFENesin (MUCINEX) 600 MG 12 hr tablet Take 1 tablet (600 mg total) by mouth 2 (two) times daily as needed. 20 tablet Particia Nearing, [place]      PDMP not reviewed this encounter.   Particia Nearing, [place] 03/03/23 1136

## 2023-03-03 NOTE — ED Triage Notes (Signed)
 Pt reports he has a cough, chest congestion, burning in chest , and fever x 5 days

## 2023-04-06 ENCOUNTER — Other Ambulatory Visit: Payer: Self-pay | Admitting: Nurse Practitioner

## 2023-04-09 NOTE — Telephone Encounter (Signed)
 Pt no longer under prescribers care  Requested Prescriptions  Refused Prescriptions Disp Refills   ofloxacin (FLOXIN) 0.3 % OTIC solution [Pharmacy Med Name: OFLOXACIN 0.3% OTIC SOLN (EAR)] 5 mL 0    Sig: INSTILL 10 DROPS TO RIGHT EAR DAILY FOR 7 DAYS     There is no refill protocol information for this order

## 2024-01-23 ENCOUNTER — Encounter (HOSPITAL_COMMUNITY): Payer: Self-pay

## 2024-01-23 ENCOUNTER — Other Ambulatory Visit: Payer: Self-pay | Admitting: Otolaryngology

## 2024-01-23 NOTE — Pre-Procedure Instructions (Signed)
 Surgical Instructions   Your procedure is scheduled on January 30, 2024. Report to [facility] Main Entrance A at 6:30 A.M., then check in with the Admitting office. Any questions or running late day of surgery: call [phone]  Questions prior to your surgery date: call [phone], Monday-Friday, 8am-4pm. If you experience any cold or flu symptoms such as cough, fever, chills, shortness of breath, etc. between now and your scheduled surgery, please notify us  at the above number.     Remember:  Do not eat after midnight the night before your surgery   You may drink clear liquids until 5:30 the morning of your surgery.   Clear liquids allowed are: Water , Non-Citrus Juices (without pulp), Carbonated Beverages, Clear Tea (no milk, honey, etc.), Black Coffee Only (NO MILK, CREAM OR POWDERED CREAMER of any kind), and Gatorade.    Take these medicines the morning of surgery with A SIP OF WATER  IF NEEDED: Fluticasone  (Flonase ) nasal spray   One week prior to surgery, STOP taking any Aspirin (unless otherwise instructed by your surgeon) Aleve, Naproxen, Ibuprofen, Motrin, Advil, Goody's, BC's, all herbal medications, fish oil, and non-prescription vitamins.                     Do NOT Smoke (Tobacco/Vaping) for 24 hours prior to your procedure.  If you use a CPAP at night, you may bring your mask/headgear for your overnight stay.   You will be asked to remove any contacts, glasses, piercing's, hearing aid's, dentures/partials prior to surgery. Please bring cases for these items if needed.    Your surgeon will determine if you are to be admitted or discharged the same day.  Patients discharged the day of surgery will not be allowed to drive home, and someone needs to stay with them for 24 hours.  SURGICAL WAITING ROOM VISITATION Patients may have no more than 2 support people in the waiting area - these visitors may rotate.   Pre-op nurse will coordinate an appropriate time for 2 ADULT  support persons, who may not rotate, to accompany patient in pre-op.  Children under the age of 65 must have an adult with them who is not the patient and must remain in the main waiting area with an adult.  If the patient needs to stay at the hospital during part of their recovery, the visitor guidelines for inpatient rooms apply.  Please refer to the [facility] website for the visitor guidelines for any additional information.   If you received a COVID test during your pre-op visit  it is requested that you wear a mask when out in public, stay away from anyone that may not be feeling well and notify your surgeon if you develop symptoms. If you have been in contact with anyone that has tested positive in the last 10 days please notify you surgeon.      Pre-operative CHG Bathing Instructions   You can play a key role in reducing the risk of infection after surgery. Your skin needs to be as free of germs as possible. You can reduce the number of germs on your skin by washing with CHG (chlorhexidine gluconate) soap before surgery. CHG is an antiseptic soap that kills germs and continues to kill germs even after washing.   DO NOT use if you have an allergy to chlorhexidine/CHG or antibacterial soaps. If your skin becomes reddened or irritated, stop using the CHG and notify one of our RNs at [phone].  TAKE A SHOWER THE NIGHT BEFORE SURGERY   Please keep in mind the following:  DO NOT shave, including legs and underarms, 48 hours prior to surgery.   You may shave your face before/day of surgery.  Place clean sheets on your bed the night before surgery Use a clean washcloth (not used since being washed) for shower. DO NOT sleep with pet's night before surgery.  CHG Shower Instructions:  Wash your face and private area with normal soap. If you choose to wash your hair, wash first with your normal shampoo.  After you use shampoo/soap, rinse your hair and body thoroughly to  remove shampoo/soap residue.  Turn the water  OFF and apply half the bottle of CHG soap to a CLEAN washcloth.  Apply CHG soap ONLY FROM YOUR NECK DOWN TO YOUR TOES (washing for 3-5 minutes)  DO NOT use CHG soap on face, private areas, open wounds, or sores.  Pay special attention to the area where your surgery is being performed.  If you are having back surgery, having someone wash your back for you may be helpful. Wait 2 minutes after CHG soap is applied, then you may rinse off the CHG soap.  Pat dry with a clean towel  Put on clean pajamas    Additional instructions for the day of surgery: If you choose, you may shower the morning of surgery with an antibacterial soap.  DO NOT APPLY any lotions, deodorants, cologne, or perfumes.   Do not wear jewelry or makeup Do not wear nail polish, gel polish, artificial nails, or any other type of covering on natural nails (fingers and toes) Do not bring valuables to the hospital. [facility] is not responsible for valuables/personal belongings. Put on clean/comfortable clothes.  Please brush your teeth.  Ask your nurse before applying any prescription medications to the skin.

## 2024-01-24 ENCOUNTER — Other Ambulatory Visit: Payer: Self-pay

## 2024-01-24 ENCOUNTER — Encounter (HOSPITAL_COMMUNITY): Payer: Self-pay

## 2024-01-24 ENCOUNTER — Encounter (HOSPITAL_COMMUNITY)
Admission: RE | Admit: 2024-01-24 | Discharge: 2024-01-24 | Disposition: A | Discharge status: Home / Self Care | Source: Ambulatory Visit | Attending: Otolaryngology | Admitting: Otolaryngology

## 2024-01-24 VITALS — BP 144/70 | HR 79 | Temp 98.6°F | Resp 16 | Ht 72.0 in | Wt 297.7 lb

## 2024-01-24 DIAGNOSIS — I517 Cardiomegaly: Secondary | ICD-10-CM | POA: Diagnosis present

## 2024-01-24 DIAGNOSIS — Z01810 Encounter for preprocedural cardiovascular examination: Secondary | ICD-10-CM | POA: Diagnosis present

## 2024-01-24 DIAGNOSIS — R9431 Abnormal electrocardiogram [ECG] [EKG]: Secondary | ICD-10-CM | POA: Diagnosis not present

## 2024-01-24 DIAGNOSIS — Z01818 Encounter for other preprocedural examination: Secondary | ICD-10-CM

## 2024-01-24 DIAGNOSIS — Z01812 Encounter for preprocedural laboratory examination: Secondary | ICD-10-CM | POA: Insufficient documentation

## 2024-01-24 HISTORY — DX: Pneumonia, unspecified organism: J18.9

## 2024-01-24 LAB — BASIC METABOLIC PANEL WITH GFR
Anion gap: 10 (ref 5–15)
BUN: 14 mg/dL (ref 8–23)
CO2: 26 mmol/L (ref 22–32)
Calcium: 9.4 mg/dL (ref 8.9–10.3)
Chloride: 103 mmol/L (ref 98–111)
Creatinine, Ser: 0.88 mg/dL (ref 0.61–1.24)
GFR, Estimated: >60 mL/min
Glucose, Bld: 106 mg/dL — ABNORMAL HIGH (ref 70–99)
Potassium: 4.3 mmol/L (ref 3.5–5.1)
Sodium: 139 mmol/L (ref 135–145)

## 2024-01-24 LAB — CBC
HCT: 48.3 % (ref 39.0–52.0)
Hemoglobin: 17.3 g/dL — ABNORMAL HIGH (ref 13.0–17.0)
MCH: 31.5 pg (ref 26.0–34.0)
MCHC: 35.8 g/dL (ref 30.0–36.0)
MCV: 87.8 fL (ref 80.0–100.0)
Platelets: 228 K/uL (ref 150–400)
RBC: 5.5 MIL/uL (ref 4.22–5.81)
RDW: 13.1 % (ref 11.5–15.5)
WBC: 8.9 K/uL (ref 4.0–10.5)
nRBC: 0 % (ref 0.0–0.2)

## 2024-01-24 LAB — SURGICAL PCR SCREEN
MRSA, PCR: NEGATIVE
Staphylococcus aureus: POSITIVE — AB

## 2024-01-24 NOTE — Progress Notes (Signed)
 PCP - Dr. Norleen Hurst Cardiologist - Denies  PPM/ICD - Denies Device Orders - n/a Rep Notified - n/a  Chest x-ray - 03/03/2023 EKG - 01/24/2024 Stress Test - Denies ECHO - 05/01/2021 after PE diagnosis Cardiac Cath - Denies  Sleep Study - +OSA. Pts CPAP machine broke around 7 years ago and never followed up with a new one  No DM  Last dose of GLP1 agonist- n/a GLP1 instructions: n/a   Blood Thinner Instructions: n/a Aspirin Instructions: n/a  ERAS Protcol - Clear liquids until 0530 morning of surgery PRE-SURGERY Ensure or G2- n/a  COVID TEST- n/a   Anesthesia review: Yes. EKG review. EKG completed due to SOB with walking up two or more flights of stairs. Denied any CP associated with SOB.  Patient denies shortness of breath, fever, cough and chest pain at PAT appointment. Pt denies any respiratory illness/infection in the last two months.   All instructions explained to the patient, with a verbal understanding of the material. Patient agrees to go over the instructions while at home for a better understanding. Patient also instructed to self quarantine after being tested for COVID-19. The opportunity to ask questions was provided.

## 2024-01-25 NOTE — Progress Notes (Signed)
 Anesthesia Chart Review:  64 year old male with pertinent history including former smoker, OSA not on CPAP, PE 04/2021 treated with EKOS, SCC s/p extensive head and neck surgeries (WLE, Right parotidectomy, supraclavicular island flap), class III obesity BMI 40  Preop labs reviewed, unremarkable.  EKG 01/24/2024: NSR.  Rate 75.  Cannot rule out anterior infarct, age undetermined.  TTE 05/01/2021 (Care Everywhere, post EKOS treatment for PE): SUMMARY  The left ventricular size is normal with mild concentric hypertrophy.  Left ventricular systolic function is normal; LVEF = 60-65%. Left  ventricular filling pattern is prolonged relaxation.  The right ventricle is mild to moderately dilated. The right  ventricular systolic function is mildly reduced.  There is no significant valvular stenosis or regurgitation.  Compared to the 4-21 study, the RV size and function are significantly  improved.       Bryce Lewis [facility] Short Stay Center/Anesthesiology Phone [phone] 01/25/2024 12:34 PM

## 2024-01-25 NOTE — Anesthesia Preprocedure Evaluation (Addendum)
 "                                  Anesthesia Evaluation  Patient identified by MRN, date of birth, ID band Patient awake    Reviewed: Allergy & Precautions, NPO status , Patient's Chart, lab work & pertinent test results, reviewed documented beta blocker date and time   History of Anesthesia Complications Negative for: history of anesthetic complications  Airway Mallampati: III  TM Distance: >3 FB     Dental no notable dental hx.    Pulmonary neg shortness of breath, sleep apnea and Continuous Positive Airway Pressure Ventilation , pneumonia, former smoker, PE PE 04/2021 treated with EKOS   breath sounds clear to auscultation       Cardiovascular (-) angina (-) CAD and (-) Past MI  Rhythm:Regular Rate:Normal  TTE 05/01/2021 (Care Everywhere, post EKOS treatment for PE): SUMMARY  The left ventricular size is normal with mild concentric hypertrophy.  Left ventricular systolic function is normal; LVEF = 60-65%. Left  ventricular filling pattern is prolonged relaxation.  The right ventricle is mild to moderately dilated. The right  ventricular systolic function is mildly reduced.  There is no significant valvular stenosis or regurgitation.  Compared to the 4-21 study, the RV size and function are significantly  improved.     Neuro/Psych neg Seizures    GI/Hepatic ,,,(+) neg Cirrhosis        Endo/Other    Class 3 obesity  Renal/GU Renal disease     Musculoskeletal   Abdominal   Peds  Hematology   Anesthesia Other Findings SCC s/p extensive head and neck surgeries (WLE, Right parotidectomy, supraclavicular island flap)  Reproductive/Obstetrics                              Anesthesia Physical Anesthesia Plan  ASA: 3  Anesthesia Plan: General   Post-op Pain Management:    Induction: Intravenous  PONV Risk Score and Plan: 2 and Ondansetron  and Dexamethasone   Airway Management Planned: Oral ETT and Video Laryngoscope  Planned  Additional Equipment:   Intra-op Plan:   Post-operative Plan: Extubation in OR  Informed Consent: I have reviewed the patients History and Physical, chart, labs and discussed the procedure including the risks, benefits and alternatives for the proposed anesthesia with the patient or authorized representative who has indicated his/her understanding and acceptance.     Dental advisory given  Plan Discussed with: CRNA  Anesthesia Plan Comments: (PAT note by Lynwood Hope, PA-C: 64 year old male with pertinent history including former smoker, OSA not on CPAP, PE 04/2021 treated with EKOS, SCC s/p extensive head and neck surgeries (WLE, Right parotidectomy, supraclavicular island flap), class III obesity BMI 40  Preop labs reviewed, unremarkable.  EKG 01/24/2024: NSR.  Rate 75.  Cannot rule out anterior infarct, age undetermined.  TTE 05/01/2021 (Care Everywhere, post EKOS treatment for PE): SUMMARY  The left ventricular size is normal with mild concentric hypertrophy.  Left ventricular systolic function is normal; LVEF = 60-65%. Left  ventricular filling pattern is prolonged relaxation.  The right ventricle is mild to moderately dilated. The right  ventricular systolic function is mildly reduced.  There is no significant valvular stenosis or regurgitation.  Compared to the 4-21 study, the RV size and function are significantly  improved.    )         Anesthesia Quick  Evaluation  "

## 2024-01-30 ENCOUNTER — Ambulatory Visit (HOSPITAL_COMMUNITY): Payer: Self-pay | Admitting: Physician Assistant

## 2024-01-30 ENCOUNTER — Ambulatory Visit (HOSPITAL_COMMUNITY): Admitting: Anesthesiology

## 2024-01-30 ENCOUNTER — Encounter (HOSPITAL_COMMUNITY): Payer: Self-pay | Admitting: Otolaryngology

## 2024-01-30 ENCOUNTER — Encounter (HOSPITAL_COMMUNITY)
Admission: RE | Disposition: A | Discharge status: Home / Self Care | Payer: Self-pay | Source: Home / Self Care | Attending: Otolaryngology

## 2024-01-30 ENCOUNTER — Ambulatory Visit (HOSPITAL_COMMUNITY)
Admission: RE | Admit: 2024-01-30 | Discharge: 2024-01-30 | Disposition: A | Discharge status: Home / Self Care | Attending: Otolaryngology | Admitting: Otolaryngology

## 2024-01-30 ENCOUNTER — Other Ambulatory Visit: Payer: Self-pay

## 2024-01-30 DIAGNOSIS — G4730 Sleep apnea, unspecified: Secondary | ICD-10-CM | POA: Insufficient documentation

## 2024-01-30 DIAGNOSIS — M950 Acquired deformity of nose: Secondary | ICD-10-CM | POA: Diagnosis present

## 2024-01-30 DIAGNOSIS — E66813 Obesity, class 3: Secondary | ICD-10-CM | POA: Diagnosis not present

## 2024-01-30 DIAGNOSIS — Z6841 Body Mass Index (BMI) 40.0 and over, adult: Secondary | ICD-10-CM | POA: Diagnosis not present

## 2024-01-30 DIAGNOSIS — Z86711 Personal history of pulmonary embolism: Secondary | ICD-10-CM | POA: Diagnosis not present

## 2024-01-30 DIAGNOSIS — F17290 Nicotine dependence, other tobacco product, uncomplicated: Secondary | ICD-10-CM | POA: Diagnosis not present

## 2024-01-30 DIAGNOSIS — C44311 Basal cell carcinoma of skin of nose: Secondary | ICD-10-CM

## 2024-01-30 HISTORY — PX: NASAL FLAP ROTATION: SHX5366

## 2024-01-30 HISTORY — PX: EXCISION NASAL MASS: SHX6271

## 2024-01-30 MED ORDER — ROCURONIUM BROMIDE 10 MG/ML (PF) SYRINGE
PREFILLED_SYRINGE | INTRAVENOUS | Status: DC | PRN
  Administered 2024-01-30: 100 mg via INTRAVENOUS

## 2024-01-30 MED ORDER — FENTANYL CITRATE (PF) 250 MCG/5ML IJ SOLN
INTRAMUSCULAR | Status: AC
  Filled 2024-01-30: qty 5

## 2024-01-30 MED ORDER — CHLORHEXIDINE GLUCONATE 0.12 % MT SOLN
15.0000 mL | Freq: Once | OROMUCOSAL | Status: AC
  Administered 2024-01-30: 15 mL via OROMUCOSAL
  Filled 2024-01-30: qty 15

## 2024-01-30 MED ORDER — DEXAMETHASONE SOD PHOSPHATE PF 10 MG/ML IJ SOLN
INTRAMUSCULAR | Status: DC | PRN
  Administered 2024-01-30: 10 mg via INTRAVENOUS

## 2024-01-30 MED ORDER — LACTATED RINGERS IV SOLN: INTRAVENOUS | Status: DC

## 2024-01-30 MED ORDER — OXYCODONE HCL 5 MG PO TABS: 5.0000 mg | ORAL_TABLET | Freq: Once | ORAL | Status: DC | PRN

## 2024-01-30 MED ORDER — BACITRACIN ZINC 500 UNIT/GM EX OINT
TOPICAL_OINTMENT | CUTANEOUS | Status: AC
  Filled 2024-01-30: qty 28.35

## 2024-01-30 MED ORDER — FENTANYL CITRATE (PF) 100 MCG/2ML IJ SOLN: 25.0000 ug | INTRAMUSCULAR | Status: DC | PRN

## 2024-01-30 MED ORDER — DEXAMETHASONE SOD PHOSPHATE PF 10 MG/ML IJ SOLN
INTRAMUSCULAR | Status: AC
  Filled 2024-01-30: qty 1

## 2024-01-30 MED ORDER — OXYCODONE HCL 5 MG/5ML PO SOLN: 5.0000 mg | Freq: Once | ORAL | Status: DC | PRN

## 2024-01-30 MED ORDER — LIDOCAINE 2% (20 MG/ML) 5 ML SYRINGE
INTRAMUSCULAR | Status: AC
  Filled 2024-01-30: qty 5

## 2024-01-30 MED ORDER — FENTANYL CITRATE (PF) 250 MCG/5ML IJ SOLN
INTRAMUSCULAR | Status: DC | PRN
  Administered 2024-01-30: 50 ug via INTRAVENOUS
  Administered 2024-01-30: 100 ug via INTRAVENOUS
  Administered 2024-01-30 (×2): 50 ug via INTRAVENOUS

## 2024-01-30 MED ORDER — LIDOCAINE 2% (20 MG/ML) 5 ML SYRINGE
INTRAMUSCULAR | Status: DC | PRN
  Administered 2024-01-30: 100 mg via INTRAVENOUS

## 2024-01-30 MED ORDER — MIDAZOLAM HCL (PF) 2 MG/2ML IJ SOLN
INTRAMUSCULAR | Status: DC | PRN
  Administered 2024-01-30: 2 mg via INTRAVENOUS

## 2024-01-30 MED ORDER — PROPOFOL 10 MG/ML IV BOLUS
INTRAVENOUS | Status: DC | PRN
  Administered 2024-01-30: 200 mg via INTRAVENOUS

## 2024-01-30 MED ORDER — LIDOCAINE-EPINEPHRINE 1 %-1:100000 IJ SOLN
INTRAMUSCULAR | Status: AC
  Filled 2024-01-30: qty 2

## 2024-01-30 MED ORDER — ONDANSETRON HCL 4 MG/2ML IJ SOLN
INTRAMUSCULAR | Status: DC | PRN
  Administered 2024-01-30: 4 mg via INTRAVENOUS

## 2024-01-30 MED ORDER — DROPERIDOL 2.5 MG/ML IJ SOLN
0.6250 mg | Freq: Once | INTRAMUSCULAR | Status: AC
  Administered 2024-01-30: 0.625 mg via INTRAVENOUS

## 2024-01-30 MED ORDER — CEFAZOLIN SODIUM-DEXTROSE 3-4 GM/150ML-% IV SOLN
3.0000 g | INTRAVENOUS | Status: AC
  Administered 2024-01-30: 3 g via INTRAVENOUS
  Filled 2024-01-30: qty 150

## 2024-01-30 MED ORDER — BSS IO SOLN
INTRAOCULAR | Status: AC
  Filled 2024-01-30: qty 30

## 2024-01-30 MED ORDER — MIDAZOLAM HCL 2 MG/2ML IJ SOLN
INTRAMUSCULAR | Status: AC
  Filled 2024-01-30: qty 2

## 2024-01-30 MED ORDER — ORAL CARE MOUTH RINSE: 15.0000 mL | Freq: Once | OROMUCOSAL | Status: AC

## 2024-01-30 MED ORDER — HYDROCODONE-ACETAMINOPHEN 5-325 MG PO TABS: 1.0000 | ORAL_TABLET | Freq: Four times a day (QID) | ORAL | 0 refills | Status: AC | PRN

## 2024-01-30 MED ORDER — ONDANSETRON HCL 4 MG/2ML IJ SOLN
INTRAMUSCULAR | Status: AC
  Filled 2024-01-30: qty 2

## 2024-01-30 MED ORDER — SUGAMMADEX SODIUM 200 MG/2ML IV SOLN
INTRAVENOUS | Status: DC | PRN
  Administered 2024-01-30: 260 mg via INTRAVENOUS

## 2024-01-30 MED ORDER — DROPERIDOL 2.5 MG/ML IJ SOLN
INTRAMUSCULAR | Status: AC
  Filled 2024-01-30: qty 2

## 2024-01-30 MED ORDER — LIDOCAINE-EPINEPHRINE 1 %-1:100000 IJ SOLN
INTRAMUSCULAR | Status: DC | PRN
  Administered 2024-01-30: 5.5 mL

## 2024-01-30 MED ORDER — PROPOFOL 10 MG/ML IV BOLUS
INTRAVENOUS | Status: AC
  Filled 2024-01-30: qty 20

## 2024-01-30 MED ORDER — ONDANSETRON HCL 4 MG/2ML IJ SOLN: 4.0000 mg | Freq: Once | INTRAMUSCULAR | Status: DC | PRN

## 2024-01-30 MED ORDER — BACITRACIN ZINC 500 UNIT/GM EX OINT
TOPICAL_OINTMENT | CUTANEOUS | Status: DC | PRN
  Administered 2024-01-30: 1 via TOPICAL

## 2024-01-30 MED ORDER — BUPIVACAINE HCL (PF) 0.25 % IJ SOLN
INTRAMUSCULAR | Status: AC
  Filled 2024-01-30: qty 30

## 2024-01-30 MED ORDER — ACETAMINOPHEN 10 MG/ML IV SOLN: 1000.0000 mg | Freq: Once | INTRAVENOUS | Status: DC | PRN

## 2024-01-30 MED ORDER — 0.9 % SODIUM CHLORIDE (POUR BTL) OPTIME
TOPICAL | Status: DC | PRN
  Administered 2024-01-30: 1000 mL

## 2024-01-30 MED ORDER — ROCURONIUM BROMIDE 10 MG/ML (PF) SYRINGE
PREFILLED_SYRINGE | INTRAVENOUS | Status: AC
  Filled 2024-01-30: qty 10

## 2024-01-30 MED ORDER — BSS IO SOLN
INTRAOCULAR | Status: DC | PRN
  Administered 2024-01-30: 15 mL

## 2024-01-30 NOTE — Op Note (Signed)
 OPERATIVE NOTE  [facility] Date/Time of Admission: 01/30/2024  6:09 AM  CSN: [id number];FMW:[id number] Attending Provider: Luciano Standing, MD Room/Bed: MCPO/NONE DOB: November 02, 1960 Age: 64 y.o.   Pre-Op Diagnosis: Basal cell carcinoma of skin of nose; Acquired deformity of external nose  Post-Op Diagnosis: Basal cell carcinoma of skin of nose; Acquired deformity of external nose  Procedure: Adjacent tissue transfer nose <10cm2 CPT 14060 Excision wound head <100cm2 in preparation for flap CPT 15004  Anesthesia: General  Surgeon(s): Standing KANDICE Luciano, MD  Staff: Circulator: Storm Maryelizabeth CROME, RN Scrub Person: Dannielle Grayce SAUNDERS Circulator Assistant: Tomas Krabbe, RN  Implants: * No implants in log *  Specimens: * No specimens in log *  Complications: none  EBL: 30 ML  IVF: Per anesthesia ML  Condition: stable  Operative Findings:  2x1.5cm circular mohs defect of nasal dorsum    Description of Operation:  The patient was identified in the preoperative area and consent confirmed in the chart.  He was brought to the operating room by the anesthetist and a preoperative huddle was performed confirming patient identity and procedure to be performed.  Once all were in agreement we proceed with surgery.  General anesthesia was induced and the patient intubated with a ETT.  This was secured and patient was then turned 90 degrees from the anesthetist.  The patient's nasal dressings were removed demonstrating the large nasal defect approximately 2 x 1.5 cm of the nasal dorsum 1% lido with epi. We then prepped and draped patient in standard fashion for procedure of this kind.  A final preoperative pause was performed and we proceeded with surgery.  I proceeded with excision and debridement of the nasal wound 2x1.5cm for inset of the flap the wound edges were sharply cleansed and a sub-SMAS plane was developed sharply ~1cm circumferentially to allow for tension-free  closure.  Hemostasis was achieved with the Bovie.    I then proceeded with adjacent tissue transfer nose <10cm2 using a laterally based transposition sliding glabellar flap 3x2cm to reconstruct the 1.5x2cm dorsal defect. A 15 blade was used to incise the flap into the glabellar region with appropriate back cut.  A subcutaneous plane was developed sharply with 15 blade. Bleeding controlled with bovie. The flap was transposed and inset into the defect with buried interrupted 5-0 Monocryl sutures and interrupted 5-0 fast and 6-0 nylon sutures for skin closure. The donor site resulted in a 2x1cm secondary defect which was dissected laterally in a subcutaneous plane and closed with buried interrupted 5-0 monocryl sutures and interrupted 5-0 fast and 6-0 nylon sutures for skin.   The flap was reassessed demonstrating good capillary refill and perfusion. The wound's were then dressed with a combination of bacitracin , Telfa and brown paper tape.  The patient was then turned back to the anesthetist who extubated him brought to the recovery room in stable condition.  All counts were correct and final.   Standing KANDICE Luciano, MD [facility]  01/30/2024

## 2024-01-30 NOTE — Transfer of Care (Signed)
 Immediate Anesthesia Transfer of Care Note  Patient: Bryce Lewis  Procedure(s) Performed: REPAIR OF RIGHT NOSE MOHS DEFECT WITH EXCISION OF WOUND (Right: Face) CREATION, FLAP, NOSE (Right: Nose)  Patient Location: PACU  Anesthesia Type:General  Level of Consciousness: awake, alert , and oriented  Airway & Oxygen Therapy: Patient Spontanous Breathing and Patient connected to face mask oxygen  Post-op Assessment: Report given to RN and Post -op Vital signs reviewed and stable  Post vital signs: Reviewed and stable  Last Vitals:  Vitals Value Taken Time  BP 136/66 01/30/24 10:16  Temp 36.9 C 01/30/24 10:16  Pulse 64 01/30/24 10:22  Resp 12 01/30/24 10:22  SpO2 94 % 01/30/24 10:22  Vitals shown include unfiled device data.  Last Pain:  Vitals:   01/30/24 1016  TempSrc:   PainSc: Asleep         Complications: No notable events documented.

## 2024-01-30 NOTE — Anesthesia Postprocedure Evaluation (Signed)
"   Anesthesia Post Note  Patient: Bryce Lewis  Procedure(s) Performed: REPAIR OF RIGHT NOSE MOHS DEFECT WITH EXCISION OF WOUND (Right: Face) CREATION, FLAP, NOSE (Right: Nose)     Patient location during evaluation: PACU Anesthesia Type: General Level of consciousness: awake and alert Pain management: pain level controlled Vital Signs Assessment: post-procedure vital signs reviewed and stable Respiratory status: spontaneous breathing, nonlabored ventilation, respiratory function stable and patient connected to nasal cannula oxygen Cardiovascular status: blood pressure returned to baseline and stable Postop Assessment: no apparent nausea or vomiting Anesthetic complications: no   No notable events documented.  Last Vitals:  Vitals:   01/30/24 1045 01/30/24 1100  BP: 122/79 121/73  Pulse: 67 62  Resp: 11 11  Temp:  36.6 C  SpO2: 92% 92%    Last Pain:  Vitals:   01/30/24 1100  TempSrc:   PainSc: 0-No pain                 Lynwood MARLA Cornea      "

## 2024-01-30 NOTE — H&P (Signed)
 Bryce Lewis is an 64 y.o. male.    Chief Complaint:  Acquired deformity of nose  HPI: Patient presents today for planned elective procedure.  He/she denies any interval change in history since office visit on 12/14/23.   Past Medical History:  Diagnosis Date   Cancer [facility])    Bone cancer as a child. Squamous to face   Nodular basal cell carcinoma (BCC) 02/04/2020   Left Forehead   Pneumonia    SCCA (squamous cell carcinoma) of skin 02/04/2020   Left Scalp (in situ)([id number])   SCCA (squamous cell carcinoma) of skin 02/04/2020   Right Temple (well diff)   SCCA (squamous cell carcinoma) of skin 02/04/2020   Right Neck (in situ)   Sleep apnea    CPAP 7   Sleep disturbance 06/23/2013   Squamous cell carcinoma of skin 04/08/2020   in situ- left superior helix (curet and excision)   Superficial nodular basal cell carcinoma (BCC) 02/04/2020   Left Chest    Past Surgical History:  Procedure Laterality Date   BONE MARROW ASPIRATION     age 40   COLONOSCOPY N/A 08/13/2014   Procedure: COLONOSCOPY;  Surgeon: Claudis RAYMOND Rivet, MD;  Location: [facility];  Service: Endoscopy;  Laterality: N/A;  220 - moved to 8:30 - Ann notified pt   NECK DISSECTION  09/14/2011   Procedure: SELECTIVE NECK DISSECTION; Surgeon: Lonni DELENA Archer, MD; Location: [facility] MAIN OR; Service: ENT; Laterality: Right;   PAROTIDECTOMY  09/14/2011   Procedure: PAROTIDECTOMY; Surgeon: Lonni DELENA Archer, MD; Location: [facility] MAIN OR; Service: ENT; Laterality: Right; RIGHT selective neck dissection/EXCISION RIGHT NASAL CANCER   squamous cell carcinoma to rt side of face removed at [facility]      Family History  Problem Relation Age of Onset   Hypertension Mother    Parkinson's disease Mother    Heart disease Father    Cancer Brother     Social History:  reports that he has quit smoking. His smoking use included cigarettes. He uses smokeless tobacco. He reports that he does not currently use alcohol. He reports  that he does not use drugs.  Allergies: Allergies[1]  Medications Prior to Admission  Medication Sig Dispense Refill   fluticasone  (FLONASE ) 50 MCG/ACT nasal spray Place 2 sprays into both nostrils daily. (Patient taking differently: Place 2 sprays into both nostrils as needed for allergies.) 16 g 0   ibuprofen (ADVIL,MOTRIN) 200 MG tablet Take 400 mg by mouth every 6 (six) hours as needed for moderate pain (pain score 4-6).     guaiFENesin  (MUCINEX ) 600 MG 12 hr tablet Take 1 tablet (600 mg total) by mouth 2 (two) times daily as needed. (Patient not taking: Reported on 01/23/2024) 20 tablet 0    No results found for this or any previous visit (from the past 48 hours). No results found.  ROS: negative other than stated in HPI  Blood pressure (!) 155/91, pulse 61, temperature (!) 97.2 F (36.2 C), temperature source Oral, resp. rate 18, height 6' (1.829 m), weight 135 kg, SpO2 98%.  PHYSICAL EXAM: General: Resting comfortably in NAD  Lungs: Non-labored respiratinos  Studies Reviewed: none   Assessment/Plan Acquired deformity of nose BCC skin of nose  Informed consent obtained. RBA discussed including risks of pain, bleeding, infection, scarring, numbness, poor cosmesis, need for further surgery, risks of anesthesia. Despite these risks the patient requested to proceed with surgery. Discussed possibility of 2 stage surgery with forehead flap.      Electronically  signed by:  Elspeth Coddington, MD  Facial Plastic & Reconstructive Surgery Otolaryngology - Head and Neck Surgery [facility]  01/30/2024, 8:33 AM       [1]  Allergies Allergen Reactions   Lansoprazole Swelling    Red faced

## 2024-01-30 NOTE — Anesthesia Procedure Notes (Signed)
 Procedure Name: Intubation Date/Time: 01/30/2024 8:53 AM  Performed by: Breely Panik J, CRNAPre-anesthesia Checklist: Patient identified, Emergency Drugs available, Suction available and Patient being monitored Patient Re-evaluated:Patient Re-evaluated prior to induction Oxygen Delivery Method: Circle System Utilized Preoxygenation: Pre-oxygenation with 100% oxygen Induction Type: IV induction Ventilation: Mask ventilation without difficulty Laryngoscope Size: Glidescope and 4 Grade View: Grade I Tube type: Oral Tube size: 7.5 mm Number of attempts: 1 Airway Equipment and Method: Stylet and Oral airway Placement Confirmation: ETT inserted through vocal cords under direct vision, positive ETCO2 and breath sounds checked- equal and bilateral Secured at: 23 cm Tube secured with: Tape Dental Injury: Teeth and Oropharynx as per pre-operative assessment

## 2024-01-30 NOTE — Discharge Instructions (Signed)

## 2024-01-31 ENCOUNTER — Encounter (HOSPITAL_COMMUNITY): Payer: Self-pay | Admitting: Otolaryngology
